# Patient Record
Sex: Female | Born: 1990 | Race: Black or African American | Hispanic: No | Marital: Single | Smoking: Never smoker
Health system: Southern US, Community
[De-identification: ages and names within clinical notes are randomized; demographics above are authoritative.]

## PROBLEM LIST (undated history)

## (undated) DIAGNOSIS — Z203 Contact with and (suspected) exposure to rabies: Secondary | ICD-10-CM

---
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
## 2012-01-06 ENCOUNTER — Encounter (HOSPITAL_COMMUNITY): Payer: Self-pay | Admitting: Emergency Medicine

## 2012-01-06 ENCOUNTER — Emergency Department (HOSPITAL_COMMUNITY)
Admission: EM | Admit: 2012-01-06 | Discharge: 2012-01-06 | Disposition: A | Discharge status: Home Health | Payer: Worker's Compensation | Attending: Emergency Medicine | Admitting: Emergency Medicine

## 2012-01-06 DIAGNOSIS — S61409A Unspecified open wound of unspecified hand, initial encounter: Secondary | ICD-10-CM | POA: Insufficient documentation

## 2012-01-06 DIAGNOSIS — Y990 Civilian activity done for income or pay: Secondary | ICD-10-CM | POA: Insufficient documentation

## 2012-01-06 DIAGNOSIS — W268XXA Contact with other sharp object(s), not elsewhere classified, initial encounter: Secondary | ICD-10-CM | POA: Insufficient documentation

## 2012-01-06 DIAGNOSIS — Y9289 Other specified places as the place of occurrence of the external cause: Secondary | ICD-10-CM | POA: Insufficient documentation

## 2012-01-06 DIAGNOSIS — S61412A Laceration without foreign body of left hand, initial encounter: Secondary | ICD-10-CM

## 2012-01-06 MED ORDER — NAPROXEN 500 MG PO TABS: 500.0000 mg | ORAL_TABLET | Freq: Two times a day (BID) | ORAL | Status: DC

## 2012-01-06 NOTE — ED Notes (Signed)
PT. PRESENTS WITH LACERATION AT RIGHT PALM SUSTAINED FROM A METAL AT WALL WHILE WORKING AT [employer] [facility] THIS EVENING.

## 2012-01-06 NOTE — ED Notes (Signed)
Patient given discharge paperwork; went over discharge instructions with patient.  Patient instructed to take naproxen as directed, to follow up with Urgent care in 7-10 days to have sutures removed, to keep wound covered and dry until tomorrow, and to wash with warm, soapy water beginning tomorrow.  Patient instructed to watch for signs/symptoms of infection, and to return to the ED for new, worsening, or concerning symptoms.

## 2012-01-06 NOTE — ED Provider Notes (Signed)
History     CSN: [id number]  Arrival date & time 01/06/12  1945   First MD Initiated Contact with Patient 01/06/12 2022      Chief Complaint  Patient presents with  . Laceration    (Consider location/radiation/quality/duration/timing/severity/associated sxs/prior treatment) HPI Comments: Patient with flap laceration just distal to base of right fifth digit on the palm sustained on a metal edge while at work. Patient rinsed with water prior to arrival and applied pressure. She has full range of motion of right fingers and hand. Patient states she had a tetanus shot when she was 18. No other treatments prior to arrival.  Patient is a 21 y.o. female presenting with skin laceration. The history is provided by the patient.  Laceration  The incident occurred 1 to 2 hours ago. The laceration is located on the right hand. The laceration is 3 cm in size. The laceration mechanism was a a metal edge. The pain is mild. The pain has been constant since onset. She reports no foreign bodies present. Her tetanus status is UTD.    History reviewed. No pertinent past medical history.  History reviewed. No pertinent past surgical history.  No family history on file.  History  Substance Use Topics  . Smoking status: Never Smoker   . Smokeless tobacco: Not on file  . Alcohol Use: No    OB History    Grav Para Term Preterm Abortions TAB SAB Ect Mult Living                  Review of Systems  Constitutional: Negative for activity change.  Musculoskeletal: Negative for joint swelling and arthralgias.  Skin: Positive for wound.  Neurological: Negative for weakness and numbness.    Allergies  Peanut-containing drug products; Penicillins; and Pineapple  Home Medications  No current outpatient prescriptions on file.  BP 137/82  Pulse 114  Temp(Src) 98.2 F (36.8 C) (Oral)  Resp 20  SpO2 97%  LMP 12/29/2011  Physical Exam  Nursing note and vitals reviewed. Constitutional: She is  oriented to person, place, and time. She appears well-developed and well-nourished.  HENT:  Head: Normocephalic and atraumatic.  Eyes: Pupils are equal, round, and reactive to light.  Neck: Normal range of motion. Neck supple.  Cardiovascular: Exam reveals no decreased pulses.   Pulses:      Radial pulses are 2+ on the right side, and 2+ on the left side.  Musculoskeletal: She exhibits tenderness. She exhibits no edema.       Hands:      Laceration explored. Mild oozing of blood. The wound extends only into subcutaneous tissue. No evidence of tendon involvement visualized. Patient has full range of motion in her fingers and hands. Normal cap refill in this finger. No foreign bodies identified on exam.  Neurological: She is alert and oriented to person, place, and time. No sensory deficit.       Motor, sensation, and vascular distal to the injury is fully intact.   Skin: Skin is warm and dry.  Psychiatric: She has a normal mood and affect.    ED Course  Procedures (including critical care time)  Labs Reviewed - No data to display No results found.   1. Laceration of left hand     8:52 PM Patient seen and examined. Pt states tetanus is UTD.  Vital signs reviewed and are as follows: Filed Vitals:   01/06/12 1956  BP: 137/82  Pulse: 114  Temp: 98.2 F (36.8 C)  Resp: 20   LACERATION REPAIR Performed by: Carolee Rota Authorized by: Carolee Rota Consent: Verbal consent obtained. Risks and benefits: risks, benefits and alternatives were discussed Consent given by: patient Patient identity confirmed: provided demographic data Prepped and Draped in normal sterile fashion Wound explored  Laceration Location: right palm  Laceration Length: 3cm  No Foreign Bodies seen or palpated, no tendon involvement identifed  Anesthesia: local infiltration  Local anesthetic: lidocaine 2% without epinephrine  Anesthetic total: 2 ml  Irrigation method: skin scrub with dermal  cleanser  Amount of cleaning: standard  Skin closure: 4-0 ethilon  Number of sutures: 3  Technique: simple interrupted  Patient tolerance: Patient tolerated the procedure well with no immediate complications.  8:54 PM Patient informed that given exam and history there is very low suspicion of foreign body, however discussed possibility of retained foreign body despite best effort to clean wound.  Patient was counseled on s/s of retained FB and infection and to return with worsening redness, pain, swelling, bleeding or discharge from wound, or if they have any other concerns.  Parents and patient verbalized understanding and agreed with plan.    8:55 PM Pt urged to return with worsening pain, worsening swelling, expanding area of redness or streaking up extremity, fever, or any other concerns. Pt verbalizes understanding and agrees with plan.  MDM  Laceration, no tendon involvement suspected. No foreign bodies identified. Patient has full range of motion in right fifth digit.        Renne Crigler, [place] 01/06/12 2111

## 2012-01-06 NOTE — ED Notes (Signed)
Patient cut right palm right below her fourth and fifth fingers on a piece of metal while working at [employer]; patient reports that the manager sprayed disinfectant spray on her palm to try and stop the bleeding.  Manager called Urgent Care and told patient to wrap up her hand with gauze; patient tried to go to Urgent Care tonight, but Urgent Care was closed.  Patient has right palm wrapped with gauze and tape at this time; no active bleeding noted.  Patient does not know when the last time she had her tetanus shot last.  PA is at bedside examining patient.  Will continue to monitor.

## 2012-01-06 NOTE — Discharge Instructions (Signed)
Please read and follow instructions below.   You will need to see your family doctor or go to Urgent care in 7-10 days to have the sutures removed.    Keep the wound covered and dry tonight.  You may wash with warm, soapy water beginning tomorrow.  Do not soak wound in water.   Please returnsooner with worsening pain, worsening redness or swelling around the wound, drainage from the wound, or if you have any other concerns.

## 2012-01-06 NOTE — ED Notes (Signed)
Sutures covered with dressing, per PA request.

## 2012-01-06 NOTE — ED Notes (Signed)
Registration at bedside.

## 2012-01-06 NOTE — ED Notes (Signed)
Suture cart placed at bedside. 

## 2012-01-07 NOTE — ED Provider Notes (Signed)
Medical screening examination/treatment/procedure(s) were performed by non-physician practitioner and as supervising physician I was immediately available for consultation/collaboration.   Gwyneth Sprout, MD 01/07/12 [phone]

## 2012-03-15 ENCOUNTER — Encounter (HOSPITAL_COMMUNITY): Payer: Self-pay

## 2012-03-15 ENCOUNTER — Emergency Department (INDEPENDENT_AMBULATORY_CARE_PROVIDER_SITE_OTHER)
Admission: EM | Admit: 2012-03-15 | Discharge: 2012-03-15 | Disposition: A | Discharge status: Home / Self Care | Payer: Medicaid Other | Source: Home / Self Care | Attending: Emergency Medicine | Admitting: Emergency Medicine

## 2012-03-15 DIAGNOSIS — M549 Dorsalgia, unspecified: Secondary | ICD-10-CM

## 2012-03-15 LAB — POCT URINALYSIS DIP (DEVICE)
Bilirubin Urine: NEGATIVE
Glucose, UA: NEGATIVE mg/dL
Ketones, ur: NEGATIVE mg/dL
Leukocytes, UA: NEGATIVE
Nitrite: NEGATIVE
pH: 6 (ref 5.0–8.0)

## 2012-03-15 LAB — POCT PREGNANCY, URINE: Preg Test, Ur: NEGATIVE

## 2012-03-15 MED ORDER — DICLOFENAC SODIUM 75 MG PO TBEC: 75.0000 mg | DELAYED_RELEASE_TABLET | Freq: Two times a day (BID) | ORAL | Status: AC

## 2012-03-15 NOTE — Discharge Instructions (Signed)
Should followup with your Dr. if pain persists beyond 4-6 weeks. In the meantime try to exercise her lower back can take this medicine for 7 days. Your exam was not suggestive of a kidney infection or any urinary infection    Back Exercises Back exercises help treat and prevent back injuries. The goal of back exercises is to increase the strength of your abdominal and back muscles and the flexibility of your back. These exercises should be started when you no longer have back pain. Back exercises include:  Pelvic Tilt. Lie on your back with your knees bent. Tilt your pelvis until the lower part of your back is against the floor. Hold this position 5 to 10 sec and repeat 5 to 10 times.   Knee to Chest. Pull first 1 knee up against your chest and hold for 20 to 30 seconds, repeat this with the other knee, and then both knees. This may be done with the other leg straight or bent, whichever feels better.   Sit-Ups or Curl-Ups. Bend your knees 90 degrees. Start with tilting your pelvis, and do a partial, slow sit-up, lifting your trunk only 30 to 45 degrees off the floor. Take at least 2 to 3 seconds for each sit-up. Do not do sit-ups with your knees out straight. If partial sit-ups are difficult, simply do the above but with only tightening your abdominal muscles and holding it as directed.   Hip-Lift. Lie on your back with your knees flexed 90 degrees. Push down with your feet and shoulders as you raise your hips a couple inches off the floor; hold for 10 seconds, repeat 5 to 10 times.   Back arches. Lie on your stomach, propping yourself up on bent elbows. Slowly press on your hands, causing an arch in your low back. Repeat 3 to 5 times. Any initial stiffness and discomfort should lessen with repetition over time.   Shoulder-Lifts. Lie face down with arms beside your body. Keep hips and torso pressed to floor as you slowly lift your head and shoulders off the floor.  Do not overdo your exercises,  especially in the beginning. Exercises may cause you some mild back discomfort which lasts for a few minutes; however, if the pain is more severe, or lasts for more than 15 minutes, do not continue exercises until you see your caregiver. Improvement with exercise therapy for back problems is slow.  See your caregivers for assistance with developing a proper back exercise program. Document Released: 11/23/2004 Document Revised: 10/05/2011 Document Reviewed: 10/16/2005 [facility] Patient Information 2012 [place], [place].Back Exercises Back exercises help treat and prevent back injuries. The goal is to increase your strength in your belly (abdominal) and back muscles. These exercises can also help with flexibility. Start these exercises when told by your doctor. HOME CARE Back exercises include: Pelvic Tilt.  Lie on your back with your knees bent. Tilt your pelvis until the lower part of your back is against the floor. Hold this position 5 to 10 sec. Repeat this exercise 5 to 10 times.  Knee to Chest.  Pull 1 knee up against your chest and hold for 20 to 30 seconds. Repeat this with the other knee. This may be done with the other leg straight or bent, whichever feels better. Then, pull both knees up against your chest.  Sit-Ups or Curl-Ups.  Bend your knees 90 degrees. Start with tilting your pelvis, and do a partial, slow sit-up. Only lift your upper half 30 to 45 degrees off  the floor. Take at least 2 to 3 seonds for each sit-up. Do not do sit-ups with your knees out straight. If partial sit-ups are difficult, simply do the above but with only tightening your belly (abdominal) muscles and holding it as told.  Hip-Lift.  Lie on your back with your knees flexed 90 degrees. Push down with your feet and shoulders as you raise your hips 2 inches off the floor. Hold for 10 seconds, repeat 5 to 10 times.  Back Arches.  Lie on your stomach. Prop yourself up on bent elbows. Slowly press on your hands,  causing an arch in your low back. Repeat 3 to 5 times.  Shoulder-Lifts.  Lie face down with arms beside your body. Keep hips and belly pressed to floor as you slowly lift your head and shoulders off the floor.  Do not overdo your exercises. Be careful in the beginning. Exercises may cause you some mild back discomfort. If the pain lasts for more than 15 minutes, stop the exercises until you see your doctor. Improvement with exercise for back problems is slow.  Document Released: 11/18/2010 Document Revised: 10/05/2011 Document Reviewed: 11/18/2010 [facility] Patient Information 2012 [place], [place].

## 2012-03-15 NOTE — ED Provider Notes (Signed)
History     CSN: [id number]  Arrival date & time 03/15/12  7829   First MD Initiated Contact with Patient 03/15/12 1003      Chief Complaint  Patient presents with  . Back Pain    (Consider location/radiation/quality/duration/timing/severity/associated sxs/prior treatment) HPI Comments: Mother brings her child in that she's been complaining of lower back pains on and off for several days. She is also concerned that she has multiple urinary infection she is concerned that maybe her daughter also inherited that rest. She did describes yesterday having some discomfort with urination and has been having this in term attempt pains on her lower and upper back, "it changes and goes from the right to the left side" . No fevers, no abdominal pains, no vaginal bleeding, no vaginal discharge. Describes movement and activity makes the pain worse at times but other times it doesn't make any difference.  Patient denies any constitutional symptoms such as fevers, or unintentional weight loss, fatigue changes in appetite.  Patient is a 21 y.o. female presenting with back pain.  Back Pain  Pertinent negatives include no chest pain, no fever, no dysuria and no pelvic pain.    History reviewed. No pertinent past medical history.  History reviewed. No pertinent past surgical history.  History reviewed. No pertinent family history.  History  Substance Use Topics  . Smoking status: Never Smoker   . Smokeless tobacco: Not on file  . Alcohol Use: No    OB History    Grav Para Term Preterm Abortions TAB SAB Ect Mult Living                  Review of Systems  Constitutional: Negative for fever, chills, activity change, appetite change, fatigue and unexpected weight change.  Respiratory: Negative for shortness of breath.   Cardiovascular: Negative for chest pain.  Genitourinary: Positive for urgency. Negative for dysuria, frequency, flank pain, vaginal bleeding, vaginal discharge, genital sores,  vaginal pain, menstrual problem and pelvic pain.  Musculoskeletal: Positive for back pain. Negative for joint swelling and gait problem.  Neurological: Negative for dizziness.    Allergies  Peanut-containing drug products; Penicillins; and Pineapple  Home Medications   Current Outpatient Rx  Name Route Sig Dispense Refill  . DICLOFENAC SODIUM 75 MG PO TBEC Oral Take 1 tablet (75 mg total) by mouth 2 (two) times daily. 10 tablet 0    BP 124/78  Pulse 96  Temp(Src) 98.3 F (36.8 C) (Oral)  Resp 18  SpO2 97%  LMP 02/23/2012  Physical Exam  Nursing note and vitals reviewed. Constitutional: Vital signs are normal. She appears well-developed and well-nourished.  Non-toxic appearance. She does not have a sickly appearance. She does not appear ill. No distress.  HENT:  Head: Normocephalic.  Eyes: Conjunctivae are normal.  Neck: Neck supple.  Cardiovascular: Normal rate.  Exam reveals no gallop and no friction rub.   No murmur heard. Pulmonary/Chest: Effort normal.  Abdominal: Soft.  Musculoskeletal: She exhibits no edema.       Lumbar back: She exhibits tenderness and pain. She exhibits normal range of motion, no bony tenderness, no swelling, no edema, no deformity, no spasm and normal pulse.       Back:  Neurological: She is alert.  Skin: No rash noted. No erythema.    ED Course  Procedures (including critical care time)   Labs Reviewed  POCT URINALYSIS DIP (DEVICE)  POCT PREGNANCY, URINE  URINALYSIS, DIPSTICK ONLY   No results found.  1. Back pain       MDM   Patient with a generalized back pain with a non-objective exam seem to be tender all over her lumbar region. No obvious deformities history of recent injuries or falls. No hematuria or proteinuria. Have discussed with mother to take her back to her pediatrician for followup if discomfort continue to persist. At this point patient does not seem to have a urinary tract infection.       Heather Molly,  MD 03/15/12 1101

## 2012-03-15 NOTE — ED Notes (Addendum)
Per patient and parent, pain  In lower back, better w laying on stomach rather than back(denies radiation of pain in to legs or buttocks) denies STD type of syp , but does have pain w urination ; no change in back pain w massage

## 2013-06-13 ENCOUNTER — Emergency Department (HOSPITAL_COMMUNITY)
Admission: EM | Admit: 2013-06-13 | Discharge: 2013-06-13 | Disposition: A | Discharge status: Home / Self Care | Payer: Self-pay | Attending: Emergency Medicine | Admitting: Emergency Medicine

## 2013-06-13 ENCOUNTER — Emergency Department (HOSPITAL_COMMUNITY): Payer: Self-pay

## 2013-06-13 ENCOUNTER — Encounter (HOSPITAL_COMMUNITY): Payer: Self-pay | Admitting: Emergency Medicine

## 2013-06-13 DIAGNOSIS — Y92009 Unspecified place in unspecified non-institutional (private) residence as the place of occurrence of the external cause: Secondary | ICD-10-CM | POA: Insufficient documentation

## 2013-06-13 DIAGNOSIS — Y9389 Activity, other specified: Secondary | ICD-10-CM | POA: Insufficient documentation

## 2013-06-13 DIAGNOSIS — S61409A Unspecified open wound of unspecified hand, initial encounter: Secondary | ICD-10-CM | POA: Insufficient documentation

## 2013-06-13 DIAGNOSIS — Z23 Encounter for immunization: Secondary | ICD-10-CM | POA: Insufficient documentation

## 2013-06-13 DIAGNOSIS — W540XXA Bitten by dog, initial encounter: Secondary | ICD-10-CM | POA: Insufficient documentation

## 2013-06-13 MED ORDER — TETANUS-DIPHTH-ACELL PERTUSSIS 5-2.5-18.5 LF-MCG/0.5 IM SUSP
0.5000 mL | Freq: Once | INTRAMUSCULAR | Status: AC
  Administered 2013-06-13: 0.5 mL via INTRAMUSCULAR
  Filled 2013-06-13: qty 0.5

## 2013-06-13 MED ORDER — RABIES VACCINE, PCEC IM SUSR
1.0000 mL | Freq: Once | INTRAMUSCULAR | Status: AC
  Administered 2013-06-13: 1 mL via INTRAMUSCULAR
  Filled 2013-06-13: qty 1

## 2013-06-13 MED ORDER — TRAMADOL HCL 50 MG PO TABS
50.0000 mg | ORAL_TABLET | Freq: Once | ORAL | Status: AC
  Administered 2013-06-13: 50 mg via ORAL
  Filled 2013-06-13: qty 1

## 2013-06-13 MED ORDER — RABIES IMMUNE GLOBULIN 150 UNIT/ML IM INJ
20.0000 [IU]/kg | INJECTION | Freq: Once | INTRAMUSCULAR | Status: AC
  Administered 2013-06-13: 1500 [IU]
  Filled 2013-06-13: qty 10

## 2013-06-13 MED ORDER — DOXYCYCLINE HYCLATE 100 MG PO CAPS: 100.0000 mg | ORAL_CAPSULE | Freq: Two times a day (BID) | ORAL | Status: DC

## 2013-06-13 MED ORDER — HYDROCODONE-ACETAMINOPHEN 5-325 MG PO TABS: 1.0000 | ORAL_TABLET | ORAL | Status: DC | PRN

## 2013-06-13 NOTE — ED Provider Notes (Signed)
CSN: [id number]     Arrival date & time 06/13/13  1652 History    This chart was scribed for Emilia Beck, PA working with Shon Baton, MD by Quintella Reichert, ED Scribe. This patient was seen in room TR05C/TR05C and the patient's care was started at 4:59 PM.      Chief Complaint  Patient presents with  . Animal Bite    Patient is a 22 y.o. female presenting with animal bite. The history is provided by the patient. No language interpreter was used.  Animal Bite Contact animal:  Dog (pitbull) Location:  Hand Hand injury location:  R hand Time since incident: several hours ago. Pain details:    Severity:  No pain Provoked: unprovoked   Animal's rabies vaccination status:  Unknown Animal in possession: no   Tetanus status:  Up to date Relieved by:  None tried Worsened by:  Nothing tried Ineffective treatments:  None tried Associated symptoms: no fever, no numbness and no rash      HPI Comments: Heather Joyce is a 22 y.o. female who presents to the Emergency Department complaining of 2 dog bites to the right hand sustained earlier today.  Pt states that she was in her yard when a pitbull came out of the woods and bit her in the hand without provocation and her father had to "beat the dog off."  She does not know who the owner of the dog is and does not know whether the dog is up to date on rabies vaccinations.  Presently she notes 2 moderately painful lacerations to the top of her right hand and thumb.  Bleeding is controlled.  She denies weakness, numbness or tingling.     No past medical history on file.   No past surgical history on file.   No family history on file.   History  Substance Use Topics  . Smoking status: Never Smoker   . Smokeless tobacco: Not on file  . Alcohol Use: No    OB History   Grav Para Term Preterm Abortions TAB SAB Ect Mult Living                   Review of Systems  Constitutional: Negative for fever.  Skin: Positive for  wound. Negative for rash.  Neurological: Negative for weakness and numbness.  All other systems reviewed and are negative.      Allergies  Peanut-containing drug products; Penicillins; and Pineapple  Home Medications  No current outpatient prescriptions on file.  BP 137/91  Pulse 101  Temp(Src) 98 F (36.7 C) (Oral)  Resp 16  SpO2 100%  Physical Exam  Nursing note and vitals reviewed. Constitutional: She is oriented to person, place, and time. She appears well-developed and well-nourished. No distress.  HENT:  Head: Normocephalic and atraumatic.  Eyes: EOM are normal.  Neck: Neck supple. No tracheal deviation present.  Cardiovascular: Normal rate.   Pulmonary/Chest: Effort normal. No respiratory distress.  Musculoskeletal:       Right hand: She exhibits decreased range of motion and laceration. She exhibits normal capillary refill and no deformity.  Right hand ROM limited due to pain. Tenderness to palpation surrounding lacerations. Capillary refill sufficient distal to injury. No obvious deformity.  Neurological: She is alert and oriented to person, place, and time.  Skin: Skin is warm and dry. Laceration noted.  1.5-cm laceration to the dorsal right hand over first metacarpal bone.  Bleeding controlled. 0.5-cm laceration on dorsal aspect of right thumb.  Abrasion on distal middle finger.  Psychiatric: She has a normal mood and affect. Her behavior is normal.    ED Course  Procedures (including critical care time)  DIAGNOSTIC STUDIES: Oxygen Saturation is 100% on room air, normal by my interpretation.    COORDINATION OF CARE: 5:05 PM-Discussed treatment plan which includes hand x-ray, pain medication, antibiotics and rabies treatment with pt at bedside and pt agreed to plan.    Labs Reviewed - No data to display Dg Hand Complete Left  06/13/2013   **ADDENDUM** CREATED: 06/13/2013 19:20:23  Please ignore the above report.  This is the correct report.  *RADIOLOGY  REPORT*  Clinical Data: Right hand laceration  Right HAND - COMPLETE 3+ VIEW  Technique: Three views right hand x-ray  Comparison:  None.  Findings: Three views of the right hand submitted.  No acute fracture or subluxation.  No radiopaque foreign body.  IMPRESSION: No acute fracture or subluxation.  **END ADDENDUM** SIGNED BY: Natasha Mead, M.D.  06/13/2013   *RADIOLOGY REPORT*  Clinical Data: Laceration  LEFT HAND - COMPLETE 3+ VIEW  Comparison: None.  Findings: Three views of the left hand submitted.  No acute fracture or subluxation.  No radiopaque foreign body.  IMPRESSION: No acute fracture or subluxation.   Original Report Authenticated By: Natasha Mead, M.D.   1. Dog bite, initial encounter     MDM  6:04 PM Patient will have xray of right hand to rule out bony injury. Patient will have antibiotics, tdap, and rabies injection here. Patient will have Tramadol for pain. Vitals stable and patient afebrile. Patient will have rabies injection schedule to follow.    7:02 PM Xray unremarkable for bone injury. Patient will be discharged with augmentin and vicodin for pain. Rabies injections given.    I personally performed the services described in this documentation, which was scribed in my presence. The recorded information has been reviewed and is accurate.    Emilia Beck, PA-C 06/13/13 2002

## 2013-06-13 NOTE — ED Notes (Signed)
Patient was in her yard, and a pitbull came out of the woods and bit her in her hand.  The patient has a small laceration between her pointing finger and thumb on her right hand.  Bleeding is controlled.

## 2013-06-13 NOTE — ED Notes (Signed)
Discharge instructions and information reviewed with patient. Rabies Exposure instructions for patient printed, reviewed and signed by patient. Copy provided to patient. Copies also sent to urgent care center and pharmacy. Patient verbalized understanding and agrees with plan of care.

## 2013-06-13 NOTE — ED Notes (Signed)
Patient transported to X-ray 

## 2013-06-14 NOTE — ED Provider Notes (Signed)
Medical screening examination/treatment/procedure(s) were performed by non-physician practitioner and as supervising physician I was immediately available for consultation/collaboration.  Caitlin Ainley F Lesleyanne Politte, MD 06/14/13 0817 

## 2013-06-16 ENCOUNTER — Encounter (HOSPITAL_COMMUNITY): Payer: Self-pay | Admitting: *Deleted

## 2013-06-16 ENCOUNTER — Emergency Department (INDEPENDENT_AMBULATORY_CARE_PROVIDER_SITE_OTHER)
Admission: EM | Admit: 2013-06-16 | Discharge: 2013-06-16 | Disposition: A | Discharge status: Home / Self Care | Payer: Self-pay | Source: Home / Self Care | Attending: Emergency Medicine | Admitting: Emergency Medicine

## 2013-06-16 DIAGNOSIS — S61451D Open bite of right hand, subsequent encounter: Secondary | ICD-10-CM

## 2013-06-16 DIAGNOSIS — Z203 Contact with and (suspected) exposure to rabies: Secondary | ICD-10-CM

## 2013-06-16 MED ORDER — HYDROCODONE-ACETAMINOPHEN 5-325 MG PO TABS: 1.0000 | ORAL_TABLET | Freq: Four times a day (QID) | ORAL | Status: DC | PRN

## 2013-06-16 MED ORDER — ONDANSETRON HCL 8 MG PO TABS: ORAL_TABLET | ORAL | Status: DC

## 2013-06-16 MED ORDER — RABIES VACCINE, PCEC IM SUSR
1.0000 mL | Freq: Once | INTRAMUSCULAR | Status: AC
  Administered 2013-06-16: 1 mL via INTRAMUSCULAR

## 2013-06-16 MED ORDER — RABIES VACCINE, PCEC IM SUSR
INTRAMUSCULAR | Status: AC
  Filled 2013-06-16: qty 1

## 2013-06-16 NOTE — ED Notes (Signed)
Pt  Here   For  Rabies  Shot  And  Also  Wants  To  See  Doctor  About  Hand     She  Reports  Pain and  Swelling  r  Hand  At the  Bite  Site      She  Also  Reports     Some  Nausea         When  She  Takes  The  Doxy

## 2013-06-16 NOTE — ED Provider Notes (Signed)
CSN: [id number]     Arrival date & time 06/16/13  1211 History     First MD Initiated Contact with Patient 06/16/13 1341     Chief Complaint  Patient presents with  . Animal Bite   (Consider location/radiation/quality/duration/timing/severity/associated sxs/prior Treatment) HPI Comments: Patient here for first rabies injection. Dog bite to right hand. Doxycycline making her vomit. Also hand very painful. The pain is not increasing, it is just not resolving. There is some swelling, but there is no redness or discharge. She denies any fever, chills, or difficulty moving her fingers. There is no numbness in the hand. Her main issue is that the doxycycline is upsetting her stomach. She is taking this without food as directed on the bottle. She denies any diarrhea  Patient is a 22 y.o. female presenting with animal bite.  Animal Bite Associated symptoms: no fever and no rash     History reviewed. No pertinent past medical history. History reviewed. No pertinent past surgical history. History reviewed. No pertinent family history. History  Substance Use Topics  . Smoking status: Never Smoker   . Smokeless tobacco: Never Used  . Alcohol Use: No   OB History   Grav Para Term Preterm Abortions TAB SAB Ect Mult Living                 Review of Systems  Constitutional: Negative for fever and chills.  Eyes: Negative for visual disturbance.  Respiratory: Negative for cough and shortness of breath.   Cardiovascular: Negative for chest pain, palpitations and leg swelling.  Gastrointestinal: Negative for nausea, vomiting and abdominal pain.  Endocrine: Negative for polydipsia and polyuria.  Genitourinary: Negative for dysuria, urgency and frequency.  Musculoskeletal: Negative for myalgias and arthralgias.  Skin: Positive for wound (see history of present illness). Negative for rash.  Neurological: Negative for dizziness, weakness and light-headedness.    Allergies  Peanut-containing  drug products; Penicillins; and Pineapple  Home Medications   Current Outpatient Rx  Name  Route  Sig  Dispense  Refill  . doxycycline (VIBRAMYCIN) 100 MG capsule   Oral   Take 1 capsule (100 mg total) by mouth 2 (two) times daily.   20 capsule   0   . HYDROcodone-acetaminophen (NORCO) 5-325 MG per tablet   Oral   Take 1 tablet by mouth every 6 (six) hours as needed for pain.   20 tablet   0   . HYDROcodone-acetaminophen (NORCO/VICODIN) 5-325 MG per tablet   Oral   Take 1-2 tablets by mouth every 4 (four) hours as needed for pain.   8 tablet   0   . ondansetron (ZOFRAN) 8 MG tablet      1 tab PO 30 min prior to taking antibiotic   15 tablet   1    BP 150/82  Pulse 90  Temp(Src) 98.5 F (36.9 C) (Oral)  Resp 16  SpO2 100%  LMP 05/08/2013 Physical Exam  Constitutional: She is oriented to person, place, and time. She appears well-developed and well-nourished. No distress.  HENT:  Head: Normocephalic and atraumatic.  Eyes: Conjunctivae are normal. Pupils are equal, round, and reactive to light.  Pulmonary/Chest: Effort normal.  Musculoskeletal:       Hands: Neurological: She is alert and oriented to person, place, and time. Coordination normal.  Skin: Skin is warm and dry. No rash noted. She is not diaphoretic.  Psychiatric: She has a normal mood and affect. Judgment normal.    ED Course   Procedures (including  critical care time)  Labs Reviewed - No data to display No results found. 1. Animal bite of hand, right, subsequent encounter     MDM  8 mg Zofran 30 minutes prior to doxycycline, prescription of Vicodin for pain. Followup immediately if worsening. Otherwise keep to schedule for a rabies vaccinations   Meds ordered this encounter  Medications  . rabies vaccine (RABAVERT) injection 1 mL    Sig:   . ondansetron (ZOFRAN) 8 MG tablet    Sig: 1 tab PO 30 min prior to taking antibiotic    Dispense:  15 tablet    Refill:  1  .  HYDROcodone-acetaminophen (NORCO) 5-325 MG per tablet    Sig: Take 1 tablet by mouth every 6 (six) hours as needed for pain.    Dispense:  20 tablet    Refill:  0     Graylon Good, PA-C 06/17/13 2157

## 2013-06-16 NOTE — ED Notes (Signed)
chart review

## 2013-06-17 NOTE — ED Provider Notes (Signed)
Medical screening examination/treatment/procedure(s) were performed by non-physician practitioner and as supervising physician I was immediately available for consultation/collaboration.  Leslee Home, M.D.  Reuben Likes, MD 06/17/13 2221

## 2013-06-20 ENCOUNTER — Encounter (HOSPITAL_COMMUNITY): Payer: Self-pay | Admitting: Emergency Medicine

## 2013-06-20 ENCOUNTER — Emergency Department (HOSPITAL_COMMUNITY)
Admission: EM | Admit: 2013-06-20 | Discharge: 2013-06-20 | Disposition: A | Discharge status: Home / Self Care | Payer: Self-pay | Source: Home / Self Care

## 2013-06-20 MED ORDER — RABIES VACCINE, PCEC IM SUSR
INTRAMUSCULAR | Status: AC
  Filled 2013-06-20: qty 1

## 2013-06-20 MED ORDER — RABIES VACCINE, PCEC IM SUSR
1.0000 mL | Freq: Once | INTRAMUSCULAR | Status: AC
  Administered 2013-06-20: 1 mL via INTRAMUSCULAR

## 2013-06-20 NOTE — ED Notes (Signed)
Pt here for rabies injection.  Pt voices no concerns at this time.  

## 2013-07-06 ENCOUNTER — Encounter (HOSPITAL_COMMUNITY): Payer: Self-pay

## 2013-07-06 ENCOUNTER — Emergency Department (HOSPITAL_COMMUNITY)
Admission: EM | Admit: 2013-07-06 | Discharge: 2013-07-06 | Disposition: A | Discharge status: Home / Self Care | Payer: Self-pay | Attending: Emergency Medicine | Admitting: Emergency Medicine

## 2013-07-06 DIAGNOSIS — Z4800 Encounter for change or removal of nonsurgical wound dressing: Secondary | ICD-10-CM | POA: Insufficient documentation

## 2013-07-06 DIAGNOSIS — W540XXS Bitten by dog, sequela: Secondary | ICD-10-CM

## 2013-07-06 DIAGNOSIS — Z880 Allergy status to penicillin: Secondary | ICD-10-CM | POA: Insufficient documentation

## 2013-07-06 DIAGNOSIS — Z203 Contact with and (suspected) exposure to rabies: Secondary | ICD-10-CM | POA: Insufficient documentation

## 2013-07-06 HISTORY — DX: Contact with and (suspected) exposure to rabies: Z20.3

## 2013-07-06 MED ORDER — HYDROCODONE-ACETAMINOPHEN 5-325 MG PO TABS
2.0000 | ORAL_TABLET | Freq: Once | ORAL | Status: AC
  Administered 2013-07-06: 2 via ORAL
  Filled 2013-07-06: qty 2

## 2013-07-06 MED ORDER — ONDANSETRON 8 MG PO TBDP
8.0000 mg | ORAL_TABLET | Freq: Once | ORAL | Status: AC
  Administered 2013-07-06: 8 mg via ORAL
  Filled 2013-07-06: qty 1

## 2013-07-06 MED ORDER — AMOXICILLIN-POT CLAVULANATE 875-125 MG PO TABS: 1.0000 | ORAL_TABLET | Freq: Two times a day (BID) | ORAL | Status: DC

## 2013-07-06 NOTE — ED Notes (Signed)
Pt escorted to discharge window. Pt verbalized understanding discharge instructions. In no acute distress.  

## 2013-07-06 NOTE — ED Notes (Signed)
MD at bedside.  EDP Knapp in to evaluate this pt

## 2013-07-06 NOTE — ED Provider Notes (Signed)
CSN: [id number]     Arrival date & time 07/06/13  1759 History  This chart was scribed for Celene Kras, MD by Carl Best, ED Scribe. This patient was seen in room WTR6/WTR6 and the patient's care was started at 6:38 PM.     Chief Complaint  Patient presents with  . Wound Check    Patient is a 22 y.o. female presenting with wound check. The history is provided by the patient. No language interpreter was used.  Wound Check This is a new problem. The current episode started more than 2 days ago. The problem occurs constantly. The problem has not changed since onset.Pertinent negatives include no chest pain, no abdominal pain and no headaches. Exacerbated by: Movement. The symptoms are relieved by ice [facility]). Treatments tried: [facility] and ice compress. The treatment provided mild relief.   HPI Comments:  Heather Joyce is a 22 y.o. female brought in  to the Emergency Department complaining of a constant, non-radiating, aching pain to her right hand.  Patient stated that she was bitten by a dog at the effected area on August 15th, 2014.  She stated that she went to urgent care and received a rabies injection and antibiotics which she finished on the August 25th, 2014.  Patient then stated that the bite mark scabbed over a week ago but started swelling last 5 days ago.  Patient stated that the effected area is very hard and will sometimes cause the entire hand to ache.  Patient stated that icy hot and cold compresses alleviate the pain.  Patient states that the pain is aggravated with movement.  She also stated that she has tried elevating the hand with no relief.  Patient denies associated emesis, fever, headache, and nausea.  Patient denies other associated medical problems.  Past Medical History  Diagnosis Date  . 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 No pertinent past surgical history. No family history on file. History  Substance Use Topics  . Smoking status: Never Smoker   . Smokeless  tobacco: Never Used  . Alcohol Use: No   OB History   Grav Para Term Preterm Abortions TAB SAB Ect Mult Living                 Review of Systems  Constitutional: Negative for fever.  Cardiovascular: Negative for chest pain.  Gastrointestinal: Negative for nausea, vomiting and abdominal pain.  Skin: Positive for wound (Dog bite).  Neurological: Negative for headaches.  All other systems reviewed and are negative.    Allergies  Peanut-containing drug products; Penicillins; and Pineapple  Home Medications   Current Outpatient Rx  Name  Route  Sig  Dispense  Refill  . HYDROcodone-acetaminophen (NORCO) 5-325 MG per tablet   Oral   Take 1 tablet by mouth every 6 (six) hours as needed for pain.   20 tablet   0   . ibuprofen (ADVIL,MOTRIN) 200 MG tablet   Oral   Take 400 mg by mouth every 6 (six) hours as needed for pain.          Triage Vitals: BP 125/65  Pulse 92  Temp(Src) 99.4 F (37.4 C)  Resp 17  SpO2 100%  LMP 06/16/2013  Physical Exam  Nursing note and vitals reviewed. Constitutional: She is oriented to person, place, and time. She appears well-developed and well-nourished. No distress.  HENT:  Head: Normocephalic and atraumatic.  Right Ear: External ear normal.  Left Ear: External ear normal.  Nose:  Nose normal.  Mouth/Throat: Oropharynx is clear and moist.  Eyes: Conjunctivae are normal.  Neck: Normal range of motion.  Cardiovascular: Normal rate, regular rhythm and normal heart sounds.   Pulmonary/Chest: Effort normal and breath sounds normal. No stridor. No respiratory distress. She has no wheezes. She has no rales.  Abdominal: Soft. She exhibits no distension.  Musculoskeletal: Normal range of motion.  Neurological: She is alert and oriented to person, place, and time. She has normal strength.  Skin: Skin is warm and dry. She is not diaphoretic. There is erythema (dorsal aspect of the right hand).  Faint erythema over the dorsal aspect of the right  hand associated with mild swelling. Scar tissue palpated.   Psychiatric: She has a normal mood and affect. Her behavior is normal.    ED Course  Procedures (including critical care time)  DIAGNOSTIC STUDIES: Oxygen Saturation is 100% on room air, normal by my interpretation.    COORDINATION OF CARE: 6:41 PM- Discussed starting medications for pain with patient and patient agreed to treatment plan.    Medications  HYDROcodone-acetaminophen (NORCO/VICODIN) 5-325 MG per tablet 2 tablet (2 tablets Oral Given 07/06/13 1852)  ondansetron (ZOFRAN-ODT) disintegrating tablet 8 mg (8 mg Oral Given 07/06/13 1852)     Labs Review Labs Reviewed - No data to display Imaging Review No results found.  MDM   1. Dog bite, sequela    Pt presents after dog bite 3 weeks ago. She received rabies injections and was covered with doxycycline. Today no obvious signs of infection. Pain is likely due to scar tissue, but will cover pt with Augmentin. Neurovascularly intact. Compartment soft. Follow up with PCP. Return instructions given. Vital signs stable for discharge. Dr. Lynelle Doctor evaluated patient and agrees with plan. Patient / Family / Caregiver informed of clinical course, understand medical decision-making process, and agree with plan.   I personally performed the services described in this documentation, which was scribed in my presence. The recorded information has been reviewed and is accurate.     Mora Bellman, PA-C 07/06/13 2034

## 2013-07-06 NOTE — ED Notes (Addendum)
Dog bite 06/13/2013.  Treated for it and rabies vaccine up to date.  Pt c/o of redness, painful and swelling last Thursday. No drainage noticed. No fever

## 2013-07-07 NOTE — ED Provider Notes (Signed)
Medical screening examination/treatment/procedure(s) were performed by non-physician practitioner and as supervising physician I was immediately available for consultation/collaboration.    Shakeel Disney R Rylend Pietrzak, MD 07/07/13 0026 

## 2013-12-04 ENCOUNTER — Encounter (HOSPITAL_COMMUNITY): Payer: Self-pay | Admitting: Emergency Medicine

## 2013-12-04 ENCOUNTER — Emergency Department (HOSPITAL_COMMUNITY)
Admission: EM | Admit: 2013-12-04 | Discharge: 2013-12-04 | Disposition: A | Discharge status: Home / Self Care | Payer: Self-pay | Attending: Emergency Medicine | Admitting: Emergency Medicine

## 2013-12-04 DIAGNOSIS — L03317 Cellulitis of buttock: Principal | ICD-10-CM

## 2013-12-04 DIAGNOSIS — L0231 Cutaneous abscess of buttock: Secondary | ICD-10-CM | POA: Insufficient documentation

## 2013-12-04 DIAGNOSIS — Z8619 Personal history of other infectious and parasitic diseases: Secondary | ICD-10-CM | POA: Insufficient documentation

## 2013-12-04 DIAGNOSIS — Z880 Allergy status to penicillin: Secondary | ICD-10-CM | POA: Insufficient documentation

## 2013-12-04 MED ORDER — SULFAMETHOXAZOLE-TRIMETHOPRIM 800-160 MG PO TABS: 2.0000 | ORAL_TABLET | Freq: Two times a day (BID) | ORAL | Status: DC

## 2013-12-04 MED ORDER — HYDROCODONE-ACETAMINOPHEN 5-325 MG PO TABS: 1.0000 | ORAL_TABLET | Freq: Four times a day (QID) | ORAL | Status: DC | PRN

## 2013-12-04 MED ORDER — HYDROMORPHONE HCL PF 1 MG/ML IJ SOLN
1.0000 mg | Freq: Once | INTRAMUSCULAR | Status: AC
  Administered 2013-12-04: 1 mg via INTRAMUSCULAR
  Filled 2013-12-04: qty 1

## 2013-12-04 MED ORDER — IBUPROFEN 800 MG PO TABS: 800.0000 mg | ORAL_TABLET | Freq: Three times a day (TID) | ORAL | Status: DC | PRN

## 2013-12-04 NOTE — ED Notes (Signed)
Patient has a large abscess on left upper buttock x 5 days. Abscess has gotten worse.

## 2013-12-04 NOTE — Progress Notes (Signed)
[facility] provided pt with a list of primary care resources and [facility] information.  °

## 2013-12-04 NOTE — ED Provider Notes (Signed)
CSN: [id number]     Arrival date & time 12/04/13  [id number] History   First MD Initiated Contact with Patient 12/04/13 [phone]     Chief Complaint  Patient presents with  . Abscess   (Consider location/radiation/quality/duration/timing/severity/associated sxs/prior Treatment) HPI Patient presents to the emergency department with abscess to the left upper buttocks medially.  The patient, states, that the area started forming about 5 days, ago.  Patient, states she did not take any medications prior to arrival.  The patient denies fever, chest pain, shortness of breath, abdominal pain, diarrhea, weakness, numbness, dizziness, and or vomiting. Past Medical History  Diagnosis Date  . 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 Dental surgery     History reviewed. No pertinent family history. History  Substance Use Topics  . Smoking status: Never Smoker   . Smokeless tobacco: Never Used  . Alcohol Use: No   OB History   Grav Para Term Preterm Abortions TAB SAB Ect Mult Living                 Review of Systems All other systems negative except as documented in the HPI. All pertinent positives and negatives as reviewed in the HPI. Allergies  Peanut-containing drug products; Penicillins; and Pineapple  Home Medications   Current Outpatient Rx  Name  Route  Sig  Dispense  Refill  . ibuprofen (ADVIL,MOTRIN) 200 MG tablet   Oral   Take 400 mg by mouth every 6 (six) hours as needed for moderate pain.           LMP 11/28/2013 Physical Exam  Nursing note and vitals reviewed. Constitutional: She appears well-developed and well-nourished. No distress.  HENT:  Head: Normocephalic and atraumatic.  Eyes: Pupils are equal, round, and reactive to light.  Neurological: She is alert.  Skin: Skin is warm and dry. No rash noted. No erythema.       ED Course  Procedures (including critical care time)   INCISION AND DRAINAGE Performed by: Carlyle DollyLAWYER,Adryel Wortmann  W Consent: Verbal consent obtained. Risks and benefits: risks, benefits and alternatives were discussed Type: abscess  Body area:left upper buttock  Anesthesia: local infiltration  Incision was made with a scalpel.  Local anesthetic: lidocaine2% without epinephrine  Anesthetic total: 6 ml  Complexity: complex Blunt dissection to break up loculations  Drainage: purulent  Drainage amount: large  Packing material: half  in iodoform gauze  Patient tolerance: Patient tolerated the procedure well with no immediate complications.     Carlyle Dollyhristopher W Shawnna Pancake, PA-C 12/05/13 [phone]

## 2013-12-04 NOTE — Discharge Instructions (Signed)
Return here as needed. Follow up with the surgeon in the next 2 days or you will need to return here. Keep area covered

## 2013-12-06 ENCOUNTER — Emergency Department (HOSPITAL_COMMUNITY)
Admission: EM | Admit: 2013-12-06 | Discharge: 2013-12-06 | Disposition: A | Discharge status: Home / Self Care | Payer: Self-pay | Attending: Emergency Medicine | Admitting: Emergency Medicine

## 2013-12-06 ENCOUNTER — Encounter (HOSPITAL_COMMUNITY): Payer: Self-pay | Admitting: Emergency Medicine

## 2013-12-06 DIAGNOSIS — Z880 Allergy status to penicillin: Secondary | ICD-10-CM | POA: Insufficient documentation

## 2013-12-06 DIAGNOSIS — R110 Nausea: Secondary | ICD-10-CM | POA: Insufficient documentation

## 2013-12-06 DIAGNOSIS — Z792 Long term (current) use of antibiotics: Secondary | ICD-10-CM | POA: Insufficient documentation

## 2013-12-06 DIAGNOSIS — Z4801 Encounter for change or removal of surgical wound dressing: Secondary | ICD-10-CM | POA: Insufficient documentation

## 2013-12-06 DIAGNOSIS — Z5189 Encounter for other specified aftercare: Secondary | ICD-10-CM

## 2013-12-06 NOTE — ED Provider Notes (Signed)
Medical screening examination/treatment/procedure(s) were performed by non-physician practitioner and as supervising physician I was immediately available for consultation/collaboration.  Tally Mattox M Carla Whilden, MD 12/06/13 2012 

## 2013-12-06 NOTE — ED Provider Notes (Signed)
CSN: [id number]     Arrival date & time 12/06/13  1231 History  This chart was scribed for non-physician practitioner Arthor Captain working with Hurman Horn, MD by Carl Best, ED Scribe. This patient was seen in room WTR5/WTR5 and the patient's care was started at 1:02 PM.    Chief Complaint  Patient presents with  . Wound Check    The history is provided by the patient. No language interpreter was used.   HPI Comments: Heather Joyce is a 23 y.o. female who presents to the Emergency Department needing a wound check.  The patient states that she was seen 2 days ago in the ED for an I&D of an abscess on her gluteal cleft.  She states that the abscess was packed and she was prescribed Bactrim,  The patient states that she has been experiencing nausea since taking the Bactrim.  She states that she has been experiencing some drainage from the area.  She denies fever and chills as associated symptoms.   Past Medical History  Diagnosis Date  . 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 Dental surgery     No family history on file. History  Substance Use Topics  . Smoking status: Never Smoker   . Smokeless tobacco: Never Used  . Alcohol Use: No   OB History   Grav Para Term Preterm Abortions TAB SAB Ect Mult Living                 Review of Systems  Constitutional: Negative for fever and chills.  Gastrointestinal: Positive for nausea.  Skin: Positive for wound (I&D in gluteal cleft).  All other systems reviewed and are negative.    Allergies  Peanut-containing drug products; Penicillins; and Pineapple  Home Medications   Current Outpatient Rx  Name  Route  Sig  Dispense  Refill  . HYDROcodone-acetaminophen (NORCO/VICODIN) 5-325 MG per tablet   Oral   Take 1 tablet by mouth every 6 (six) hours as needed for moderate pain.   15 tablet   0   . ibuprofen (ADVIL,MOTRIN) 200 MG tablet   Oral   Take 400 mg by mouth every 6 (six) hours as  needed for moderate pain.          Marland Kitchen ibuprofen (ADVIL,MOTRIN) 800 MG tablet   Oral   Take 1 tablet (800 mg total) by mouth every 8 (eight) hours as needed.   21 tablet   0   . sulfamethoxazole-trimethoprim (SEPTRA DS) 800-160 MG per tablet   Oral   Take 2 tablets by mouth 2 (two) times daily.   40 tablet   0    Triage Vitals: BP 103/73  Pulse 95  Temp(Src) 98.1 F (36.7 C) (Oral)  Resp 16  SpO2 96%  LMP 11/28/2013  Physical Exam  Nursing note and vitals reviewed. Constitutional: She is oriented to person, place, and time. She appears well-developed and well-nourished.  HENT:  Head: Normocephalic and atraumatic.  Eyes: EOM are normal.  Neck: Normal range of motion.  Cardiovascular: Normal rate.   Pulmonary/Chest: Effort normal.  Musculoskeletal: Normal range of motion.  Neurological: She is alert and oriented to person, place, and time.  Skin: Skin is warm and dry.  Psychiatric: She has a normal mood and affect. Her behavior is normal.    ED Course  Procedures (including critical care time)  DIAGNOSTIC STUDIES: Oxygen Saturation is 96% on room air, adequate by my interpretation.  COORDINATION OF CARE: 1:06 PM- Cleaned the area and advised the patient that the area is healing properly.  Advised the patient to return to the ED if the area becomes infected or she starts running a fever.  Advised the patient to eat yogurt or probiotics in between her doses of Bactrim to alleviate the nausea.  The patient agreed to the treatment plan.   Labs Review Labs Reviewed - No data to display Imaging Review No results found.  EKG Interpretation   None       MDM   1. Encounter for wound re-check    Patient wound appears well healing. No need to return to ED for further checks unless no sxs, fever, nausea, or concerns for new infection.  I personally performed the services described in this documentation, which was scribed in my presence. The recorded information  has been reviewed and is accurate.     Arthor CaptainAbigail Kriss Perleberg, PA-C 12/06/13 1318

## 2013-12-06 NOTE — ED Notes (Signed)
Per pt, here 2 days ago and needs wound rechecked

## 2013-12-06 NOTE — Discharge Instructions (Signed)
Wound Check  Your wound appears healthy today. Your wound will heal gradually over time. Eventually a scar will form that will fade with time.  FACTORS THAT AFFECT SCAR FORMATION:   People differ in the severity in which they scar.   Scar severity varies according to location, size, and the traits you inherited from your parents (genetic predisposition).   Irritation to the wound from infection, rubbing, or chemical exposure will increase the amount of scar formation.  HOME CARE INSTRUCTIONS    If you were given a dressing, you should change it at least once a day or as instructed by your caregiver. If the bandage sticks, soak it off with a solution of hydrogen peroxide.   If the bandage becomes wet, dirty, or develops a bad smell, change it as soon as possible.   Look for signs of infection.   Only take over-the-counter or prescription medicines for pain, discomfort, or fever as directed by your caregiver.  SEEK IMMEDIATE MEDICAL CARE IF:    You have redness, swelling, or increasing pain in the wound.   You notice pus coming from the wound.   You have a fever.   You notice a bad smell coming from the wound or dressing.  Document Released: 07/22/2004 Document Revised: 01/08/2012 Document Reviewed: 10/16/2005  Exit[employer] Patient Information 2014 [employer].

## 2013-12-07 NOTE — ED Provider Notes (Signed)
Medical screening examination/treatment/procedure(s) were performed by non-physician practitioner and as supervising physician I was immediately available for consultation/collaboration.  EKG Interpretation   None         Baily Serpe, MD 12/07/13 0854 

## 2014-01-30 ENCOUNTER — Emergency Department (HOSPITAL_COMMUNITY)
Admission: EM | Admit: 2014-01-30 | Discharge: 2014-01-30 | Disposition: A | Discharge status: Home / Self Care | Payer: Self-pay | Attending: Emergency Medicine | Admitting: Emergency Medicine

## 2014-01-30 ENCOUNTER — Encounter (HOSPITAL_COMMUNITY): Payer: Self-pay | Admitting: Emergency Medicine

## 2014-01-30 DIAGNOSIS — Y9389 Activity, other specified: Secondary | ICD-10-CM | POA: Insufficient documentation

## 2014-01-30 DIAGNOSIS — T300 Burn of unspecified body region, unspecified degree: Secondary | ICD-10-CM

## 2014-01-30 DIAGNOSIS — T24019A Burn of unspecified degree of unspecified thigh, initial encounter: Secondary | ICD-10-CM | POA: Insufficient documentation

## 2014-01-30 DIAGNOSIS — Y929 Unspecified place or not applicable: Secondary | ICD-10-CM | POA: Insufficient documentation

## 2014-01-30 DIAGNOSIS — X12XXXA Contact with other hot fluids, initial encounter: Secondary | ICD-10-CM | POA: Insufficient documentation

## 2014-01-30 DIAGNOSIS — X131XXA Other contact with steam and other hot vapors, initial encounter: Secondary | ICD-10-CM

## 2014-01-30 NOTE — ED Provider Notes (Signed)
Medical screening examination/treatment/procedure(s) were performed by non-physician practitioner and as supervising physician I was immediately available for consultation/collaboration.  Flint MelterElliott L Kimberley Dastrup, MD 01/30/14 [phone]
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
## 2014-01-30 NOTE — Discharge Instructions (Signed)
Please read and follow all provided instructions.  Your diagnoses today include:  1. Burn     Tests performed today include:  Vital signs. See below for your results today.   Medications prescribed:   Ibuprofen (Motrin, Advil) - anti-inflammatory pain medication  Do not exceed 600mg  ibuprofen every 6 hours, take with food  You have been prescribed an anti-inflammatory medication or NSAID. Take with food. Take smallest effective dose for the shortest duration needed for your pain. Stop taking if you experience stomach pain or vomiting.   Home care instructions:  Follow any educational materials contained in this packet.  Use topical antibiotic ointment as needed.   Follow-up instructions: Please follow-up with your primary care provider as needed for further evaluation of your symptoms.  If you do not have a primary care doctor -- see below for referral information.   Return instructions:   Please return to the Emergency Department if you experience worsening symptoms.   Please return if you have any other emergent concerns.  Additional Information:  Your vital signs today were: BP 120/75   Pulse 85   Temp(Src) 97.7 F (36.5 C) (Oral)   Resp 16   Ht 5\' 4"  (1.626 m)   Wt 160 lb (72.576 kg)   BMI 27.45 kg/m2   SpO2 99%   LMP 01/16/2014 If your blood pressure (BP) was elevated above 135/85 this visit, please have this repeated by your doctor within one month. ---------------

## 2014-01-30 NOTE — ED Notes (Signed)
Patient states spilled a "pot of coffee" on her R thigh.  Patient states it happened about 815 this morning.   Patient complains of leg being red and swollen.

## 2014-10-14 ENCOUNTER — Encounter (HOSPITAL_COMMUNITY): Payer: Self-pay | Admitting: Emergency Medicine

## 2014-10-14 ENCOUNTER — Emergency Department (HOSPITAL_COMMUNITY)
Admission: EM | Admit: 2014-10-14 | Discharge: 2014-10-14 | Disposition: A | Discharge status: Home / Self Care | Payer: Self-pay | Attending: Emergency Medicine | Admitting: Emergency Medicine

## 2014-10-14 DIAGNOSIS — Z880 Allergy status to penicillin: Secondary | ICD-10-CM | POA: Insufficient documentation

## 2014-10-14 DIAGNOSIS — J029 Acute pharyngitis, unspecified: Secondary | ICD-10-CM | POA: Insufficient documentation

## 2014-10-14 LAB — RAPID STREP SCREEN (MED CTR MEBANE ONLY): Streptococcus, Group A Screen (Direct): NEGATIVE

## 2014-10-14 NOTE — ED Notes (Signed)
Per pt, states left ear ache that started yesterday, increased discomfort-states also has headache that started this am

## 2014-10-14 NOTE — ED Provider Notes (Signed)
CSN: [id number]     Arrival date & time 10/14/14  0847 History   First MD Initiated Contact with Patient 10/14/14 [phone]     Chief Complaint  Patient presents with  . Otalgia   Patient is a 23 y.o. female presenting with ear pain.  Otalgia Associated symptoms: cough, headaches and sore throat   Associated symptoms: no ear discharge and no fever     Ms. Heather Joyce is a 23 year old female who presents with L ear pain that started yesterday. Pain is associated with a runny nose, headache, sore throat, cough but she denies fever, ear discharge, sick contacts. She reports that as a child, she was told she would need her tonsils taken out at some point.  Past Medical History  Diagnosis Date  . 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 Dental surgery     No family history on file. History  Substance Use Topics  . Smoking status: Never Smoker   . Smokeless tobacco: Never Used  . Alcohol Use: No   OB History    No data available     Review of Systems  Constitutional: Negative for fever.  HENT: Positive for ear pain and sore throat. Negative for ear discharge and trouble swallowing.   Respiratory: Positive for cough.   Neurological: Positive for headaches.      Allergies  Peanut-containing drug products; Penicillins; and Pineapple  Home Medications   Prior to Admission medications   Medication Sig Start Date End Date Taking? Authorizing Provider  acetaminophen (TYLENOL) 500 MG tablet Take 1,000 mg by mouth every 6 (six) hours as needed for headache.   Yes Historical Provider, MD   BP 119/69 mmHg  Pulse 76  Temp(Src) 98.9 F (37.2 C) (Oral)  Resp 20  SpO2 98%  LMP 09/18/2014 Physical Exam  Constitutional: She is oriented to person, place, and time. She appears well-developed and well-nourished. No distress.  HENT:  Head: Normocephalic and atraumatic.  Mouth/Throat: Oropharynx is clear and moist. Mucous membranes are not dry. No oropharyngeal  exudate.  L ear with some erythema of ear canal as compared to R ear. Uvular deviation to the right though tonsils symmetric bilaterally.  Neck: No tracheal deviation present.  Cardiovascular: Normal rate, regular rhythm and normal heart sounds.  Exam reveals no gallop and no friction rub.   No murmur heard. Pulmonary/Chest: Effort normal and breath sounds normal. No stridor. No respiratory distress. She has no wheezes. She has no rales.  Abdominal: Soft. Bowel sounds are normal. She exhibits no distension. There is no tenderness. There is no rebound.  Lymphadenopathy:    She has no cervical adenopathy.  Neurological: She is alert and oriented to person, place, and time.  Skin: She is not diaphoretic.    ED Course  Procedures (including critical care time) Labs Review Labs Reviewed  RAPID STREP SCREEN  CULTURE, GROUP A STREP    Imaging Review No results found.   EKG Interpretation None      MDM   Final diagnoses:  Viral pharyngitis    Viral pharyngitis is likely. Physical exam findings reassuring to r/o peritonsillar abscess though difficult to assess tonsils. She does not meet the [facility] Criteria but will check rapid Strep to r/o strep throat.  1008AM: Rapid strep negative. Stable for discharge.  Heywood Ilesushil Sharnice Bosler, MD 10/14/14 1009

## 2014-10-14 NOTE — Discharge Instructions (Signed)

## 2014-10-14 NOTE — ED Provider Notes (Signed)
I saw and evaluated the patient, reviewed the resident's note and I agree with the findings and plan.   EKG Interpretation None     Patient here complaining of URI symptoms including sore throat and left ear pain. Left TM is normal. Posterior pharynx is erythematous without evidence of peritonsillar abscess. Will check rapid strep  Toy BakerAnthony T Cosima Prentiss, MD 10/14/14 [phone]

## 2014-10-16 LAB — CULTURE, GROUP A STREP

## 2015-03-07 ENCOUNTER — Encounter (HOSPITAL_COMMUNITY): Payer: Self-pay | Admitting: Emergency Medicine

## 2015-03-07 ENCOUNTER — Emergency Department (HOSPITAL_COMMUNITY)
Admission: EM | Admit: 2015-03-07 | Discharge: 2015-03-07 | Disposition: A | Discharge status: Home / Self Care | Payer: Self-pay | Attending: Emergency Medicine | Admitting: Emergency Medicine

## 2015-03-07 DIAGNOSIS — L239 Allergic contact dermatitis, unspecified cause: Secondary | ICD-10-CM | POA: Insufficient documentation

## 2015-03-07 DIAGNOSIS — Z880 Allergy status to penicillin: Secondary | ICD-10-CM | POA: Insufficient documentation

## 2015-03-07 MED ORDER — HYDROXYZINE HCL 10 MG PO TABS: 10.0000 mg | ORAL_TABLET | Freq: Three times a day (TID) | ORAL | Status: DC | PRN

## 2015-03-07 MED ORDER — PERMETHRIN 5 % EX CREA
TOPICAL_CREAM | Freq: Once | CUTANEOUS | Status: AC
  Administered 2015-03-07: 1 via TOPICAL
  Filled 2015-03-07: qty 60

## 2015-03-07 MED ORDER — HYDROXYZINE HCL 10 MG PO TABS
10.0000 mg | ORAL_TABLET | Freq: Once | ORAL | Status: AC
  Administered 2015-03-07: 10 mg via ORAL
  Filled 2015-03-07: qty 1

## 2015-03-07 NOTE — ED Notes (Signed)
Pt c/o extensive red rash that covers bilateral arms and some on back. States it is spreading to legs.  Denies pain but is complaining of itchiness.

## 2015-03-07 NOTE — ED Provider Notes (Signed)
CSN: [id number]     Arrival date & time 03/07/15  [id number] History   First MD Initiated Contact with Patient 03/07/15 [phone]     Chief Complaint  Patient presents with  . Rash     HPI  Patient presents with concern of rash. Rash began yesterday, after the patient slept at a relative's house. Since onset rashes itchy, diffuse, uncomfortable, with no fever, chills, nausea, vomiting. No relief with Benadryl. No difficulty breathing, speaking. Patient was well prior to the onset of symptoms.  Past Medical History  Diagnosis Date  . 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 Dental surgery     No family history on file. History  Substance Use Topics  . Smoking status: Never Smoker   . Smokeless tobacco: Never Used  . Alcohol Use: No   OB History    No data available     Review of Systems  Constitutional: Negative for fever and chills.  Gastrointestinal: Negative for nausea.  Musculoskeletal: Negative.   Skin: Positive for rash.      Allergies  Peanut-containing drug products; Penicillins; and Pineapple  Home Medications   Prior to Admission medications   Medication Sig Start Date End Date Taking? Authorizing Provider  acetaminophen (TYLENOL) 500 MG tablet Take 1,000 mg by mouth every 6 (six) hours as needed for headache.   Yes Historical Provider, MD  hydrOXYzine (ATARAX/VISTARIL) 10 MG tablet Take 1 tablet (10 mg total) by mouth 3 (three) times daily as needed for itching. 03/07/15   Gerhard Munchobert Porcia Morganti, MD   BP 128/68 mmHg  Pulse 82  Temp(Src) 98.9 F (37.2 C) (Oral)  Resp 16  SpO2 100% Physical Exam  Constitutional: She appears well-developed and well-nourished. No distress.  HENT:  Head: Normocephalic and atraumatic.  Eyes: Conjunctivae are normal. Right eye exhibits no discharge. Left eye exhibits no discharge.  Neck: No tracheal deviation present.  Pulmonary/Chest: No stridor.  Neurological: She is alert. No cranial nerve deficit.  She exhibits normal muscle tone. Coordination normal.  Skin: She is not diaphoretic.  Diffuse, erythematous papular rash throughout the posterior of both UE, the ankles, minimally on the face. No confluence, no drainage, no bleeding  Psychiatric: She has a normal mood and affect.  Nursing note and vitals reviewed.   ED Course  Procedures (including critical care time)   MDM   Final diagnoses:  Allergic dermatitis    Patient presents with rash consistent with contact dermatitis, possibly from allergic reaction to bed but, scabies, mites. No evidence for systemic infection, respiratory compromise, bacteremia or sepsis. She started on topical therapy, discharged in stable condition.    Gerhard Munchobert Audray Rumore, MD 03/07/15 [phone]

## 2015-03-07 NOTE — Discharge Instructions (Signed)
As discussed, your rash is likely due to an allergic reaction to a small insects, possibly mites.  Please be sure to have all her bedding material cleaned thoroughly.  Use the provided medication as directed, return here for concerning changes in her condition.

## 2016-06-25 ENCOUNTER — Encounter (HOSPITAL_COMMUNITY): Payer: Self-pay | Admitting: Emergency Medicine

## 2016-06-25 ENCOUNTER — Emergency Department (HOSPITAL_COMMUNITY)
Admission: EM | Admit: 2016-06-25 | Discharge: 2016-06-25 | Disposition: A | Discharge status: Home / Self Care | Payer: Self-pay | Attending: Emergency Medicine | Admitting: Emergency Medicine

## 2016-06-25 DIAGNOSIS — R112 Nausea with vomiting, unspecified: Secondary | ICD-10-CM | POA: Insufficient documentation

## 2016-06-25 LAB — CBC
HCT: 42.7 % (ref 36.0–46.0)
HEMOGLOBIN: 14.5 g/dL (ref 12.0–15.0)
MCH: 30 pg (ref 26.0–34.0)
MCHC: 34 g/dL (ref 30.0–36.0)
MCV: 88.4 fL (ref 78.0–100.0)
Platelets: 378 10*3/uL (ref 150–400)
RBC: 4.83 MIL/uL (ref 3.87–5.11)
RDW: 13.5 % (ref 11.5–15.5)
WBC: 12.5 10*3/uL — AB (ref 4.0–10.5)

## 2016-06-25 LAB — COMPREHENSIVE METABOLIC PANEL
ALT: 14 U/L (ref 14–54)
ANION GAP: 8 (ref 5–15)
AST: 18 U/L (ref 15–41)
Albumin: 5 g/dL (ref 3.5–5.0)
Alkaline Phosphatase: 49 U/L (ref 38–126)
BUN: 8 mg/dL (ref 6–20)
CHLORIDE: 107 mmol/L (ref 101–111)
CO2: 24 mmol/L (ref 22–32)
Calcium: 9.6 mg/dL (ref 8.9–10.3)
Creatinine, Ser: 0.61 mg/dL (ref 0.44–1.00)
GFR calc non Af Amer: >60 mL/min (ref >60)
Glucose, Bld: 104 mg/dL — ABNORMAL HIGH (ref 65–99)
POTASSIUM: 3.6 mmol/L (ref 3.5–5.1)
SODIUM: 139 mmol/L (ref 135–145)
Total Bilirubin: 0.6 mg/dL (ref 0.3–1.2)
Total Protein: 8.6 g/dL — ABNORMAL HIGH (ref 6.5–8.1)

## 2016-06-25 LAB — URINALYSIS, ROUTINE W REFLEX MICROSCOPIC
Bilirubin Urine: NEGATIVE
GLUCOSE, UA: NEGATIVE mg/dL
HGB URINE DIPSTICK: NEGATIVE
Ketones, ur: NEGATIVE mg/dL
LEUKOCYTES UA: NEGATIVE
Nitrite: NEGATIVE
Protein, ur: 100 mg/dL — AB
SPECIFIC GRAVITY, URINE: 1.03 (ref 1.005–1.030)
pH: 8.5 — ABNORMAL HIGH (ref 5.0–8.0)

## 2016-06-25 LAB — URINE MICROSCOPIC-ADD ON

## 2016-06-25 LAB — HCG, QUANTITATIVE, PREGNANCY: hCG, Beta Chain, Quant, S: <1 m[IU]/mL (ref <5)

## 2016-06-25 LAB — LIPASE, BLOOD: LIPASE: 27 U/L (ref 11–51)

## 2016-06-25 MED ORDER — ONDANSETRON HCL 4 MG PO TABS: 4.0000 mg | ORAL_TABLET | Freq: Four times a day (QID) | ORAL | 0 refills | Status: DC

## 2016-06-25 MED ORDER — ONDANSETRON HCL 4 MG/2ML IJ SOLN
4.0000 mg | Freq: Once | INTRAMUSCULAR | Status: AC | PRN
  Administered 2016-06-25: 4 mg via INTRAVENOUS
  Filled 2016-06-25: qty 2

## 2016-06-25 NOTE — Discharge Instructions (Signed)

## 2016-06-25 NOTE — ED Provider Notes (Signed)
Emergency Department Provider Note   I have reviewed the triage vital signs and the nursing notes.   HISTORY  Chief Complaint Emesis   HPI Heather Joyce is a 25 y.o. female with no significant PMH presents to the emergency room in for evaluation of nausea and vomiting 4 hours. Patient felt somewhat nauseated 2 days ago but vomiting developed recently. No fever. No associated diarrhea. The patient has had no associated chest discomfort or difficulty breathing. She reports her last missed her period was 5 weeks ago which is a little late for her. She's had no vaginal bleeding or discharge. Patient is sexually active. No abdominal discomfort. She is not taken any medication for vomiting. Reports it is made worse with eating.   Past Medical History:  Diagnosis Date  . 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   Past Surgical History:  Procedure Laterality Date  . DENTAL SURGERY      Current Outpatient Rx  . Order #: [id number] Class: Historical Med  . Order #: [id number] Class: Print  . Order #: [id number] Class: Print    Allergies Peanut-containing drug products; Penicillins; and Pineapple  History reviewed. No pertinent family history.  Social History Social History  Substance Use Topics  . Smoking status: Never Smoker  . Smokeless tobacco: Never Used  . Alcohol use No    Review of Systems  Constitutional: No fever/chills Eyes: No visual changes. ENT: No sore throat. Cardiovascular: Denies chest pain. Respiratory: Denies shortness of breath. Gastrointestinal: No abdominal pain. Positive nausea/vomiting.  No diarrhea.  No constipation. Genitourinary: Negative for dysuria. Musculoskeletal: Negative for back pain. Skin: Negative for rash. Neurological: Negative for headaches, focal weakness or numbness.  10-point ROS otherwise negative.  ____________________________________________   PHYSICAL EXAM:  VITAL SIGNS: ED  Triage Vitals [06/25/16 1300]  Enc Vitals Group     BP 120/91     Pulse Rate 83     Resp 14     Temp 98.1 F (36.7 C)     Temp Source Oral     SpO2 95 %     Weight 141 lb 12.8 oz (64.3 kg)     Height 5\' 4"  (1.626 m)    Constitutional: Alert and oriented. Well appearing and in no acute distress. Eyes: Conjunctivae are normal.  Head: Atraumatic. Nose: No congestion/rhinnorhea. Mouth/Throat: Mucous membranes are moist.  Oropharynx non-erythematous. Neck: No stridor.   Cardiovascular: Normal rate, regular rhythm. Good peripheral circulation. Grossly normal heart sounds.   Respiratory: Normal respiratory effort.  No retractions. Lungs CTAB. Gastrointestinal: Soft and nontender. No distention.  Musculoskeletal: No lower extremity tenderness nor edema. No gross deformities of extremities. Neurologic:  Normal speech and language. No gross focal neurologic deficits are appreciated.  Skin:  Skin is warm, dry and intact. No rash noted. Psychiatric: Mood and affect are normal. Speech and behavior are normal.  ____________________________________________   LABS (all labs ordered are listed, but only abnormal results are displayed)  Labs Reviewed  COMPREHENSIVE METABOLIC PANEL - Abnormal; Notable for the following:       Result Value   Glucose, Bld 104 (*)    Total Protein 8.6 (*)    All other components within normal limits  CBC - Abnormal; Notable for the following:    WBC 12.5 (*)    All other components within normal limits  URINALYSIS, ROUTINE W REFLEX MICROSCOPIC (NOT AT [facility]) - Abnormal; Notable for the following:    APPearance CLOUDY (*)  pH 8.5 (*)    Protein, ur 100 (*)    All other components within normal limits  URINE MICROSCOPIC-ADD ON - Abnormal; Notable for the following:    Squamous Epithelial / LPF 6-30 (*)    Bacteria, UA RARE (*)    All other components within normal limits  LIPASE, BLOOD  HCG, QUANTITATIVE, PREGNANCY    ____________________________________________   PROCEDURES  Procedure(s) performed:   Procedures  None ____________________________________________   INITIAL IMPRESSION / ASSESSMENT AND PLAN / ED COURSE  Pertinent labs & imaging results that were available during my care of the patient were reviewed by me and considered in my medical decision making (see chart for details).  Patient resents the emergency department for evaluation of nausea and vomiting. No associated abdominal discomfort. No fevers. She has a benign abdominal exam. It is slightly late for her menstrual period. Plan for by mouth challenge after Zofran, basic labs, and pregnancy test.   03:17 PM Patient is tolerating PO. Lab testing including pregnancy is unremarkable. Plan for discharge with Zofran and strong return precautions. Discussed my impression and plan for discharge in detail with the patient who is pleased at discharge.   At this time, I do not feel there is any life-threatening condition present. I have reviewed and discussed all results (EKG, imaging, lab, urine as appropriate), exam findings with patient. I have reviewed nursing notes and appropriate previous records.  I feel the patient is safe to be discharged home without further emergent workup. Discussed usual and customary return precautions. Patient and family (if present) verbalize understanding and are comfortable with this plan.  Patient will follow-up with their primary care provider. If they do not have a primary care provider, information for follow-up has been provided to them. All questions have been answered.  ____________________________________________  FINAL CLINICAL IMPRESSION(S) / ED DIAGNOSES  Final diagnoses:  Non-intractable vomiting with nausea, vomiting of unspecified type     MEDICATIONS GIVEN DURING THIS VISIT:  Medications  ondansetron (ZOFRAN) injection 4 mg (4 mg Intravenous Given 06/25/16 1329)     NEW OUTPATIENT  MEDICATIONS STARTED DURING THIS VISIT:  New Prescriptions   ONDANSETRON (ZOFRAN) 4 MG TABLET    Take 1 tablet (4 mg total) by mouth every 6 (six) hours.      Note:  This document was prepared using Dragon voice recognition software and may include unintentional dictation errors.  Alona Bene, MD Emergency Medicine   Maia Plan, MD 06/25/16 [phone]

## 2016-06-25 NOTE — ED Triage Notes (Signed)
Pt c/o nausea onset Friday, emesis onset yesterday. Pt currently vomiting. No abdominal pain. Pt late for menstrual period.

## 2016-06-25 NOTE — ED Notes (Signed)
PT TOLERATED SALTINE CRACKERS AND WATER.

## 2016-06-28 ENCOUNTER — Emergency Department (HOSPITAL_COMMUNITY)
Admission: EM | Admit: 2016-06-28 | Discharge: 2016-06-29 | Disposition: A | Discharge status: Home / Self Care | Payer: Medicaid Other | Attending: Emergency Medicine | Admitting: Emergency Medicine

## 2016-06-28 DIAGNOSIS — R112 Nausea with vomiting, unspecified: Secondary | ICD-10-CM

## 2016-06-28 LAB — URINALYSIS, ROUTINE W REFLEX MICROSCOPIC
BILIRUBIN URINE: NEGATIVE
GLUCOSE, UA: NEGATIVE mg/dL
Hgb urine dipstick: NEGATIVE
KETONES UR: NEGATIVE mg/dL
LEUKOCYTES UA: NEGATIVE
Nitrite: NEGATIVE
PROTEIN: NEGATIVE mg/dL
Specific Gravity, Urine: 1.024 (ref 1.005–1.030)
pH: 7.5 (ref 5.0–8.0)

## 2016-06-28 LAB — CBC
HCT: 40.3 % (ref 36.0–46.0)
Hemoglobin: 13.9 g/dL (ref 12.0–15.0)
MCH: 30.1 pg (ref 26.0–34.0)
MCHC: 34.5 g/dL (ref 30.0–36.0)
MCV: 87.2 fL (ref 78.0–100.0)
PLATELETS: 370 10*3/uL (ref 150–400)
RBC: 4.62 MIL/uL (ref 3.87–5.11)
RDW: 13.2 % (ref 11.5–15.5)
WBC: 9.2 10*3/uL (ref 4.0–10.5)

## 2016-06-28 MED ORDER — SODIUM CHLORIDE 0.9 % IV BOLUS (SEPSIS)
1000.0000 mL | Freq: Once | INTRAVENOUS | Status: AC
  Administered 2016-06-29: 1000 mL via INTRAVENOUS

## 2016-06-28 MED ORDER — HALOPERIDOL LACTATE 5 MG/ML IJ SOLN
2.0000 mg | Freq: Once | INTRAMUSCULAR | Status: AC
  Administered 2016-06-29: 2 mg via INTRAVENOUS
  Filled 2016-06-28: qty 1

## 2016-06-28 MED ORDER — FAMOTIDINE IN NACL 20-0.9 MG/50ML-% IV SOLN
20.0000 mg | Freq: Once | INTRAVENOUS | Status: AC
  Administered 2016-06-29: 20 mg via INTRAVENOUS
  Filled 2016-06-28: qty 50

## 2016-06-28 MED ORDER — ONDANSETRON 4 MG PO TBDP
4.0000 mg | ORAL_TABLET | Freq: Once | ORAL | Status: AC | PRN
  Administered 2016-06-28: 4 mg via ORAL
  Filled 2016-06-28: qty 1

## 2016-06-28 NOTE — ED Provider Notes (Signed)
[facility]-EMERGENCY DEPT Provider Note   CSN: [id number] Arrival date & time: 06/28/16  2313 By signing my name below, I, Heather Joyce, attest that this documentation has been prepared under the direction and in the presence of non-physician practitioner, Antony Madura, PA-C  Electronically Signed: Levon Joyce, Scribe. 06/29/2016. 12:01 AM.   History   Chief Complaint Chief Complaint  Patient presents with  . Emesis    HPI Heather Joyce is a 25 y.o. female who presents to the Emergency Department complaining of continuing nausea and vomiting which began 4 days ago. She was seen in the ED for the same 4 days ago, but symptoms have not resolved. She was prescribed Zofran, but could not afford the medication. She notes associated sharp abdominal pain last night. No alleviating or modifying factors noted. Pt states she last smoked marijuana five days ago. Per pt, she has never experienced these symptoms after smoking before. Pt states she has not had her menstrual period this month. She denies fever. No hx of abdominal surgeries.  The history is provided by the patient. No language interpreter was used.    Past Medical History:  Diagnosis Date  . 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   Past Surgical History:  Procedure Laterality Date  . DENTAL SURGERY      OB History    No data available       Home Medications    Prior to Admission medications   Medication Sig Start Date End Date Taking? Authorizing Provider  acetaminophen (TYLENOL) 500 MG tablet Take 1,000 mg by mouth every 6 (six) hours as needed for headache.    Historical Provider, MD  hydrOXYzine (ATARAX/VISTARIL) 10 MG tablet Take 1 tablet (10 mg total) by mouth 3 (three) times daily as needed for itching. 03/07/15   Gerhard Munch, MD  ondansetron (ZOFRAN) 4 MG tablet Take 1 tablet (4 mg total) by mouth every 6 (six) hours. 06/25/16   Maia Plan, MD    Family History No  family history on file.  Social History Social History  Substance Use Topics  . Smoking status: Never Smoker  . Smokeless tobacco: Never Used  . Alcohol use No     Allergies   Peanut-containing drug products; Penicillins; and Pineapple   Review of Systems Review of Systems  Constitutional: Negative for fever.  Gastrointestinal: Positive for abdominal pain, nausea and vomiting.  Ten systems reviewed and are negative for acute change, except as noted in the HPI.    Physical Exam Updated Vital Signs BP (!) 136/108 (BP Location: Left Arm)   Pulse 75   Temp 99 F (37.2 C) (Oral)   Resp 18   Ht 5\' 4"  (1.626 m)   Wt 141 lb (64 kg)   LMP 05/18/2016   SpO2 99%   BMI 24.20 kg/m   Physical Exam  Constitutional: She is oriented to person, place, and time. She appears well-developed and well-nourished. No distress.  Nontoxic appearing; in NAD  HENT:  Head: Normocephalic and atraumatic.  Eyes: Conjunctivae and EOM are normal. No scleral icterus.  Neck: Normal range of motion.  Cardiovascular: Normal rate, regular rhythm and intact distal pulses.   Pulmonary/Chest: Effort normal. No respiratory distress. She has no wheezes. She has no rales.  Respirations even and unlabored. Lungs CTAB.  Abdominal: Soft. She exhibits no distension and no mass. There is no rebound and no guarding.  Soft abdomen without focal TTP. No masses  or rigidity. No guarding or peritoneal signs.  Musculoskeletal: Normal range of motion.  Neurological: She is alert and oriented to person, place, and time.  GCS 15. Patient moving all extremities.  Skin: Skin is warm and dry. No rash noted. She is not diaphoretic. No erythema. No pallor.  Psychiatric: She has a normal mood and affect. Her behavior is normal.  Nursing note and vitals reviewed.    ED Treatments / Results  DIAGNOSTIC STUDIES:  Oxygen Saturation is 99% on RA, normal by my interpretation.    COORDINATION OF CARE:  11:54 PM Discussed  treatment plan which includes Haldol and pepcid with pt at bedside and pt agreed to plan.   Labs (all labs ordered are listed, but only abnormal results are displayed) Labs Reviewed  LIPASE, BLOOD  COMPREHENSIVE METABOLIC PANEL  CBC  URINALYSIS, ROUTINE W REFLEX MICROSCOPIC (NOT AT [facility])    EKG  EKG Interpretation None       Radiology No results found.  Procedures Procedures (including critical care time)  Medications Ordered in ED Medications  ondansetron (ZOFRAN-ODT) disintegrating tablet 4 mg (4 mg Oral Given 06/28/16 2322)  haloperidol lactate (HALDOL) injection 2 mg (2 mg Intravenous Given 06/29/16 0017)  sodium chloride 0.9 % bolus 1,000 mL (0 mLs Intravenous Stopped 06/29/16 0145)  famotidine (PEPCID) IVPB 20 mg premix (0 mg Intravenous Stopped 06/29/16 0110)     Initial Impression / Assessment and Plan / ED Course  I have reviewed the triage vital signs and the nursing notes.  Pertinent labs & imaging results that were available during my care of the patient were reviewed by me and considered in my medical decision making (see chart for details).  Clinical Course    25 year old female presents to the emergency department for evaluation of persistent nausea and vomiting. She was evaluated 4 days ago for similar symptoms. She states that she never had the Zofran prescription filled because the medication was too costly. She does report marijuana use the day before onset of her nausea and emesis. She denies sick contacts.  Patient without focal tenderness or evidence of acute surgical abdomen on exam. She has no focal right upper quadrant tenderness to suspect gallbladder etiology. Doubt SBO or pSBO as patient has been passing flatus and having regular bowel movements. She has a reassuring laboratory workup today with no leukocytosis. Liver and kidney function preserved. [name] is negative. No evidence of UTI.  Symptoms managed with Haldol and Pepcid. Patient also given 1  L of IV fluids. On reexamination, she reports complete resolution of her nausea. She has no complaints of abdominal pain. Suspect viral etiology versus cannabis related emesis. I do not believe further emergent workup is indicated at this time. Patient to be discharged with outpatient course of Phenergan to take as needed. Return precautions discussed and provided. Patient discharged in satisfactory condition with no unaddressed concerns.   Vitals:   06/28/16 2316 06/29/16 0145  BP: (!) 136/108 125/65  Pulse: 75 86  Resp: 18 18  Temp: 99 F (37.2 C) 98.4 F (36.9 C)  TempSrc: Oral Oral  SpO2: 99% 97%  Weight: 64 kg   Height: 5\' 4"  (1.626 m)     Final Clinical Impressions(s) / ED Diagnoses   Final diagnoses:  Non-intractable vomiting with nausea, vomiting of unspecified type    I personally performed the services described in this documentation, which was scribed in my presence. The recorded information has been reviewed and is accurate.    New Prescriptions New  Prescriptions   PROMETHAZINE (PHENERGAN) 25 MG TABLET    Take 1 tablet (25 mg total) by mouth every 6 (six) hours as needed for nausea or vomiting.     Antony MaduraKelly Veria Stradley, PA-C 06/29/16 [id number]    April Palumbo, MD 06/29/16 [phone]

## 2016-06-28 NOTE — ED Triage Notes (Addendum)
Pt presents via EMS for continuing N/V since Friday. Seen here but symptoms have not resolved. Sent home w/ zofran but could not afford it. Alert and oriented.

## 2016-06-29 LAB — URINE MICROSCOPIC-ADD ON: RBC / HPF: NONE SEEN RBC/hpf (ref 0–5)

## 2016-06-29 LAB — COMPREHENSIVE METABOLIC PANEL
ALK PHOS: 51 U/L (ref 38–126)
ALT: 12 U/L — AB (ref 14–54)
ANION GAP: 7 (ref 5–15)
AST: 18 U/L (ref 15–41)
Albumin: 4.9 g/dL (ref 3.5–5.0)
BILIRUBIN TOTAL: 0.9 mg/dL (ref 0.3–1.2)
BUN: 8 mg/dL (ref 6–20)
CALCIUM: 10 mg/dL (ref 8.9–10.3)
CO2: 25 mmol/L (ref 22–32)
CREATININE: 0.69 mg/dL (ref 0.44–1.00)
Chloride: 107 mmol/L (ref 101–111)
GFR calc non Af Amer: >60 mL/min (ref >60)
GLUCOSE: 104 mg/dL — AB (ref 65–99)
Potassium: 3.4 mmol/L — ABNORMAL LOW (ref 3.5–5.1)
Sodium: 139 mmol/L (ref 135–145)
TOTAL PROTEIN: 8.6 g/dL — AB (ref 6.5–8.1)

## 2016-06-29 LAB — PREGNANCY, URINE: Preg Test, Ur: NEGATIVE

## 2016-06-29 LAB — LIPASE, BLOOD: Lipase: 29 U/L (ref 11–51)

## 2016-06-29 MED ORDER — PROMETHAZINE HCL 25 MG PO TABS: 25.0000 mg | ORAL_TABLET | Freq: Four times a day (QID) | ORAL | 0 refills | Status: DC | PRN

## 2016-06-29 NOTE — ED Notes (Signed)
Pt verbalized understanding of d/c instructions and has no further questions. Pt is stable, A&Ox4, VSS, NAD 

## 2016-06-29 NOTE — ED Notes (Signed)
No respiratory or acute distress noted alert and oriented x 3 call light in reach no reaction to medication noted visitor at bedside. 

## 2016-08-06 ENCOUNTER — Encounter (HOSPITAL_COMMUNITY): Payer: Self-pay

## 2016-08-06 ENCOUNTER — Emergency Department (HOSPITAL_COMMUNITY)
Admission: EM | Admit: 2016-08-06 | Discharge: 2016-08-06 | Disposition: A | Discharge status: Home / Self Care | Payer: Medicaid Other | Attending: Emergency Medicine | Admitting: Emergency Medicine

## 2016-08-06 ENCOUNTER — Emergency Department (HOSPITAL_COMMUNITY): Payer: Medicaid Other

## 2016-08-06 DIAGNOSIS — Z331 Pregnant state, incidental: Secondary | ICD-10-CM | POA: Insufficient documentation

## 2016-08-06 DIAGNOSIS — R112 Nausea with vomiting, unspecified: Secondary | ICD-10-CM | POA: Insufficient documentation

## 2016-08-06 DIAGNOSIS — Z91010 Allergy to peanuts: Secondary | ICD-10-CM | POA: Insufficient documentation

## 2016-08-06 DIAGNOSIS — Z3201 Encounter for pregnancy test, result positive: Secondary | ICD-10-CM

## 2016-08-06 DIAGNOSIS — R197 Diarrhea, unspecified: Secondary | ICD-10-CM | POA: Insufficient documentation

## 2016-08-06 LAB — URINALYSIS, ROUTINE W REFLEX MICROSCOPIC
Glucose, UA: NEGATIVE mg/dL
Leukocytes, UA: NEGATIVE
NITRITE: NEGATIVE
PROTEIN: 100 mg/dL — AB
pH: 6.5 (ref 5.0–8.0)

## 2016-08-06 LAB — COMPREHENSIVE METABOLIC PANEL
ALK PHOS: 54 U/L (ref 38–126)
ALT: 19 U/L (ref 14–54)
AST: 24 U/L (ref 15–41)
Albumin: 4.7 g/dL (ref 3.5–5.0)
Anion gap: 14 (ref 5–15)
BILIRUBIN TOTAL: 0.7 mg/dL (ref 0.3–1.2)
BUN: 10 mg/dL (ref 6–20)
CALCIUM: 10.1 mg/dL (ref 8.9–10.3)
CO2: 22 mmol/L (ref 22–32)
CREATININE: 0.77 mg/dL (ref 0.44–1.00)
Chloride: 105 mmol/L (ref 101–111)
Glucose, Bld: 118 mg/dL — ABNORMAL HIGH (ref 65–99)
Potassium: 3.5 mmol/L (ref 3.5–5.1)
Sodium: 141 mmol/L (ref 135–145)
TOTAL PROTEIN: 8.4 g/dL — AB (ref 6.5–8.1)

## 2016-08-06 LAB — URINE MICROSCOPIC-ADD ON

## 2016-08-06 LAB — CBC
HCT: 43.1 % (ref 36.0–46.0)
HEMOGLOBIN: 14.7 g/dL (ref 12.0–15.0)
MCH: 30.3 pg (ref 26.0–34.0)
MCHC: 34.1 g/dL (ref 30.0–36.0)
MCV: 88.9 fL (ref 78.0–100.0)
PLATELETS: 322 10*3/uL (ref 150–400)
RBC: 4.85 MIL/uL (ref 3.87–5.11)
RDW: 12.8 % (ref 11.5–15.5)
WBC: 15.1 10*3/uL — ABNORMAL HIGH (ref 4.0–10.5)

## 2016-08-06 LAB — HCG, QUANTITATIVE, PREGNANCY: hCG, Beta Chain, Quant, S: 21 m[IU]/mL — ABNORMAL HIGH (ref <5)

## 2016-08-06 LAB — I-STAT BETA HCG BLOOD, ED (MC, WL, AP ONLY): HCG, QUANTITATIVE: 17.4 m[IU]/mL — AB (ref <5)

## 2016-08-06 LAB — LIPASE, BLOOD: LIPASE: 27 U/L (ref 11–51)

## 2016-08-06 MED ORDER — ONDANSETRON HCL 4 MG/2ML IJ SOLN
4.0000 mg | Freq: Once | INTRAMUSCULAR | Status: AC
  Administered 2016-08-06: 4 mg via INTRAVENOUS
  Filled 2016-08-06: qty 2

## 2016-08-06 MED ORDER — SODIUM CHLORIDE 0.9 % IV BOLUS (SEPSIS)
1000.0000 mL | Freq: Once | INTRAVENOUS | Status: AC
  Administered 2016-08-06: 1000 mL via INTRAVENOUS

## 2016-08-06 MED ORDER — ONDANSETRON 4 MG PO TBDP
4.0000 mg | ORAL_TABLET | Freq: Once | ORAL | Status: AC | PRN
  Administered 2016-08-06: 4 mg via ORAL

## 2016-08-06 MED ORDER — ONDANSETRON 4 MG PO TBDP: 4.0000 mg | ORAL_TABLET | Freq: Three times a day (TID) | ORAL | 0 refills | Status: DC | PRN

## 2016-08-06 MED ORDER — ONDANSETRON 4 MG PO TBDP
ORAL_TABLET | ORAL | Status: AC
  Filled 2016-08-06: qty 1

## 2016-08-06 NOTE — ED Notes (Signed)
Pt tolerating PO fluids well. Showing NAD.

## 2016-08-06 NOTE — ED Notes (Signed)
Pt given po fluids per EDP verbal order. Tolerating well.

## 2016-08-06 NOTE — ED Notes (Signed)
Pt back in room.

## 2016-08-06 NOTE — Discharge Instructions (Signed)
It is likely that your symptoms today are due to a viral illness. We recommend that you take Zofran as needed for persistent nausea and vomiting. Zofran is safe to take in pregnancy as you are found to have a positive pregnancy test while in the emergency department. Because your pregnancy test suggests that you are less than [redacted] week pregnant, we advise that [facility] to have this test rechecked. Return for new or concerning symptoms.

## 2016-08-06 NOTE — ED Triage Notes (Signed)
Patient here with abdominal cramping vomiting and diarrhea since 10pm. States started after eating dominos last night. Patient states she feels fatigued due to the vomiting

## 2016-08-06 NOTE — ED Notes (Signed)
Pt showing NAD. RR even and unlabored. Voices no questions/concerns at this time. 

## 2016-08-06 NOTE — ED Provider Notes (Signed)
[facility]-EM[facility] DEPT Provider Note   CSN: [id number] Arrival date & time: 08/06/16  1659    History   Chief Complaint Chief Complaint  Patient presents with  . Abdominal Pain  . Emesis    HPI Heather Joyce is a 25 y.o. female.  25 year old female presents to the emergency department for evaluation of vomiting and diarrhea. Patient states that vomiting began after eating [employer] last night. She reports too numerous to count episodes of emesis, all of which have been nonbloody. She did have multiple episodes of watery diarrhea. She reports 1 episode with a small amount of blood; however, she has had a subsequent episode while in the emergency department which was nonbloody. She denies any abdominal pain at this time. She has not taken any medications prior to arrival. She reports similar symptoms back in August. Patient states that her boyfriend also ate [employer] and is not sick. She has had no fevers, dysuria, hematuria, vaginal bleeding or discharge. No history of abdominal surgeries.      Past Medical History:  Diagnosis Date  . 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   Past Surgical History:  Procedure Laterality Date  . DENTAL SURGERY      OB History    No data available       Home Medications    Prior to Admission medications   Medication Sig Start Date End Date Taking? Authorizing Provider  acetaminophen (TYLENOL) 500 MG tablet Take 1,000 mg by mouth every 6 (six) hours as needed for mild pain or headache.    Yes Historical Provider, MD  ondansetron (ZOFRAN ODT) 4 MG disintegrating tablet Take 1 tablet (4 mg total) by mouth every 8 (eight) hours as needed for nausea or vomiting. 08/06/16   Antony Madura, PA-C  promethazine (PHENERGAN) 25 MG tablet Take 1 tablet (25 mg total) by mouth every 6 (six) hours as needed for nausea or vomiting. Patient not taking: Reported on 08/06/2016 06/29/16   Antony Madura, PA-C    Family  History No family history on file.  Social History Social History  Substance Use Topics  . Smoking status: Never Smoker  . Smokeless tobacco: Never Used  . Alcohol use No     Allergies   Peanut-containing drug products; Penicillins; and Pineapple   Review of Systems Review of Systems Ten systems reviewed and are negative for acute change, except as noted in the HPI.    Physical Exam Updated Vital Signs BP 112/56 (BP Location: Right Arm)   Pulse 95   Temp 98.2 F (36.8 C) (Oral)   Resp 15   SpO2 100%   Physical Exam  Constitutional: She is oriented to person, place, and time. She appears well-developed and well-nourished. No distress.  Nontoxic appearing and in NAD  HENT:  Head: Normocephalic and atraumatic.  Eyes: Conjunctivae and EOM are normal. No scleral icterus.  Neck: Normal range of motion.  Cardiovascular: Normal rate, regular rhythm and intact distal pulses.   Pulmonary/Chest: Effort normal. No respiratory distress. She has no wheezes.  Respirations even and unlabored  Abdominal: Soft. She exhibits no distension and no mass. There is tenderness. There is no guarding.  Soft, nondistended abdomen with TTP in bilateral upper quadrants. Negative Murphy's sign. No peritoneal signs or masses.  Musculoskeletal: Normal range of motion.  Neurological: She is alert and oriented to person, place, and time. She exhibits normal muscle tone. Coordination normal.  Skin: Skin is warm and  dry. No rash noted. She is not diaphoretic. No erythema. No pallor.  Psychiatric: She has a normal mood and affect. Her behavior is normal.  Nursing note and vitals reviewed.    ED Treatments / Results  Labs (all labs ordered are listed, but only abnormal results are displayed) Labs Reviewed  COMPREHENSIVE METABOLIC PANEL - Abnormal; Notable for the following:       Result Value   Glucose, Bld 118 (*)    Total Protein 8.4 (*)    All other components within normal limits  CBC -  Abnormal; Notable for the following:    WBC 15.1 (*)    All other components within normal limits  URINALYSIS, ROUTINE W REFLEX MICROSCOPIC (NOT AT [facility]) - Abnormal; Notable for the following:    APPearance HAZY (*)    Specific Gravity, Urine >1.030 (*)    Hgb urine dipstick LARGE (*)    Bilirubin Urine SMALL (*)    Ketones, ur >80 (*)    Protein, ur 100 (*)    All other components within normal limits  HCG, QUANTITATIVE, PREGNANCY - Abnormal; Notable for the following:    hCG, Beta Chain, Quant, S 21 (*)    All other components within normal limits  URINE MICROSCOPIC-ADD ON - Abnormal; Notable for the following:    Squamous Epithelial / LPF 0-5 (*)    Bacteria, UA FEW (*)    All other components within normal limits  I-STAT BETA HCG BLOOD, ED ([facility], [facility], [facility] ONLY) - Abnormal; Notable for the following:    I-stat hCG, quantitative 17.4 (*)    All other components within normal limits  LIPASE, BLOOD    EKG  EKG Interpretation None       Radiology Koreas Abdomen Complete  Result Date: 08/06/2016 CLINICAL DATA:  Initial evaluation for acute abdominal pain with nausea and vomiting for 3 months. EXAM: ABDOMEN ULTRASOUND COMPLETE COMPARISON:  None. FINDINGS: Gallbladder: No shadowing echogenic gallstones or wall thickening visualized. Minimal gallbladder sludge/debris noted. No sonographic Murphy sign noted by sonographer. Common bile duct: Diameter: 3.6 mm Liver: No focal lesion identified. Within normal limits in parenchymal echogenicity. IVC: No abnormality visualized. Pancreas: Visualized portion unremarkable. Spleen: Size and appearance within normal limits. Right Kidney: Length: 11 cm. Echogenicity within normal limits. No mass or hydronephrosis visualized. Left Kidney: Length: 9.8 cm. Echogenicity within normal limits. No mass or hydronephrosis visualized. Abdominal aorta: No aneurysm visualized. Other findings: None. IMPRESSION: 1. Small amount of gallbladder sludge. No sonographic  evidence for acute cholecystitis or biliary dilatation. 2. Otherwise normal abdominal ultrasound. Electronically Signed   By: Rise MuBenjamin  McClintock M.D.   On: 08/06/2016 21:35    Procedures Procedures (including critical care time)  Medications Ordered in ED Medications  ondansetron (ZOFRAN-ODT) disintegrating tablet 4 mg (4 mg Oral Given 08/06/16 1719)  sodium chloride 0.9 % bolus 1,000 mL (0 mLs Intravenous Stopped 08/06/16 2207)  ondansetron (ZOFRAN) injection 4 mg (4 mg Intravenous Given 08/06/16 2204)     Initial Impression / Assessment and Plan / ED Course  I have reviewed the triage vital signs and the nursing notes.  Pertinent labs & imaging results that were available during my care of the patient were reviewed by me and considered in my medical decision making (see chart for details).  Clinical Course    25 year old female presents to the emergency department for evaluation of nausea and vomiting which began after eating [employer] pizza last night. She also reports watery diarrhea. Patient had similar symptoms approximately  1 month ago. She had some mild bilateral upper quadrant tenderness without evidence of acute surgical abdomen. Ultrasound was performed which showed gallbladder sludge without evidence of acute cholecystitis. This may be contributing to patient's symptoms. Patient able to tolerate oral fluids after antiemetics. On reassessment, she reports feeling much better.  Patient found to be incidentally pregnant. HCG 21. The patient has been instructed to follow-up with [facility] in 2 days for repeat hCG testing. No lower abdominal pain to suggest ectopic. No UTI. Symptoms today unlikely to be related to pregnancy. Return precautions discussed and provided. Patient discharged in stable condition with no unaddressed concerns.   Final Clinical Impressions(s) / ED Diagnoses   Final diagnoses:  Nausea vomiting and diarrhea  Positive pregnancy test    New  Prescriptions Discharge Medication List as of 08/06/2016 10:56 PM    START taking these medications   Details  ondansetron (ZOFRAN ODT) 4 MG disintegrating tablet Take 1 tablet (4 mg total) by mouth every 8 (eight) hours as needed for nausea or vomiting., Starting Sun 08/06/2016, Print         Antony Madura, PA-C 08/07/16 0981    Azalia Bilis, MD 08/07/16 0100

## 2016-08-08 ENCOUNTER — Encounter (HOSPITAL_COMMUNITY): Payer: Self-pay | Admitting: *Deleted

## 2016-08-08 ENCOUNTER — Inpatient Hospital Stay (HOSPITAL_COMMUNITY)
Admission: AD | Admit: 2016-08-08 | Discharge: 2016-08-08 | Disposition: A | Discharge status: Home / Self Care | Payer: Medicaid Other | Source: Ambulatory Visit | Attending: Obstetrics and Gynecology | Admitting: Obstetrics and Gynecology

## 2016-08-08 DIAGNOSIS — O039 Complete or unspecified spontaneous abortion without complication: Secondary | ICD-10-CM | POA: Insufficient documentation

## 2016-08-08 LAB — HCG, QUANTITATIVE, PREGNANCY: HCG, BETA CHAIN, QUANT, S: 6 m[IU]/mL — AB (ref <5)

## 2016-08-08 NOTE — MAU Note (Signed)
Pt came in for a follow-up HCG from an incidental finding at a visit from [place].  She states she started her period that day and is still bleeding some, no more or different than a regular period and no cramping.

## 2016-08-08 NOTE — MAU Provider Note (Signed)
Spoke with patient briefly in triage, she was here for a repeat B-hcg, [facility]. Bhcg then was 21. She is on her "period". Bhcg today is 6. Discussed likely miscarriage. Follow up in 1 week at [facility] for repeat bhcg and possible birth control. She does not desire pregnancy. Denies any abdominal pain, not currently using contraception.  Cleda ClarksElizabeth W. Omeed Osuna, DO  OB Fellow [facility]]

## 2016-08-15 ENCOUNTER — Telehealth: Payer: Self-pay | Admitting: *Deleted

## 2016-08-15 ENCOUNTER — Other Ambulatory Visit: Payer: Self-pay

## 2016-08-15 NOTE — Telephone Encounter (Signed)
Pt was scheduled to come in for repeat hcg level today. Originally listed as stat but upon chart review just needs to be a repeat level. Called patient to get her to come in and phone just rings. Last level was 6 on 08/08/16.

## 2016-10-30 NOTE — L&D Delivery Note (Signed)
Patient is 26 y.o. G2P0010 2362w6d admitted for IOL for gHTN. S/p IOL with foley bulb, cytotec, followed by Pitocin. AROM at 0651.  Prenatal course otherwise uncomplicated.  Delivery Note At 7:50 AM a viable female was delivered via Vaginal, Spontaneous Delivery (Presentation: cephalic; ROA).  APGAR: 7, 9; weight pending.   Placenta status: grossly normal, intact.  Cord: 3 vessels without complications. Cord pH: not drawn  Anesthesia: Epidural  Episiotomy: None Lacerations: None Suture Repair: none needed Est. Blood Loss (mL): 50  Mom to postpartum.  Baby to Couplet care / Skin to Skin.  Upon arrival patient was complete and pushing. She pushed with good maternal effort to deliver a viable female infant in cephalic, ROA position.  Nuchal cord,easily reduced. Baby delivered without difficulty (anterior shoulder delivered with traction in anterior axilla), was noted to have good tone and placed on maternal abdomen for oral suctioning, drying and stimulation. Delayed cord clamping performed. Placenta delivered spontaneously with gentle cord traction. Fundus firm with massage and Pitocin. Perineum inspected and found to have no lacerations. Counts of sharps, instruments, and lap pads were all correct.   Amanda C. Frances FurbishWinfrey, MD PGY-1, [facility] 07/05/2017 8:04 AM  Patient is a G1 at 5062w6d who was admitted for IOL due to gHTN, significant hx of uncomplicated prenatal course until the dx of gHTN yesterday.  She progressed with augmentation via cyto/FB/briefly Pit/AROM.  I was gloved and present for delivery in its entirety.  Second stage of labor progressed, baby delivered after a few contractions.  No decels during second stage noted.  Complications: none  Lacerations: none  EBL: 50cc  Cam HaiSHAW, KIMBERLY, CNM 8:32 AM 07/05/2017

## 2016-11-08 ENCOUNTER — Encounter (HOSPITAL_COMMUNITY): Payer: Self-pay | Admitting: Emergency Medicine

## 2016-11-08 ENCOUNTER — Emergency Department (HOSPITAL_COMMUNITY)
Admission: EM | Admit: 2016-11-08 | Discharge: 2016-11-08 | Disposition: A | Discharge status: Home / Self Care | Payer: Medicaid Other | Attending: Emergency Medicine | Admitting: Emergency Medicine

## 2016-11-08 DIAGNOSIS — O211 Hyperemesis gravidarum with metabolic disturbance: Secondary | ICD-10-CM | POA: Insufficient documentation

## 2016-11-08 DIAGNOSIS — O210 Mild hyperemesis gravidarum: Secondary | ICD-10-CM

## 2016-11-08 DIAGNOSIS — Z91018 Allergy to other foods: Secondary | ICD-10-CM | POA: Insufficient documentation

## 2016-11-08 DIAGNOSIS — Z79899 Other long term (current) drug therapy: Secondary | ICD-10-CM | POA: Insufficient documentation

## 2016-11-08 DIAGNOSIS — Z3A01 Less than 8 weeks gestation of pregnancy: Secondary | ICD-10-CM | POA: Insufficient documentation

## 2016-11-08 LAB — PREGNANCY, URINE: PREG TEST UR: POSITIVE — AB

## 2016-11-08 LAB — URINALYSIS, ROUTINE W REFLEX MICROSCOPIC
BACTERIA UA: NONE SEEN
Glucose, UA: NEGATIVE mg/dL
HGB URINE DIPSTICK: NEGATIVE
Ketones, ur: 80 mg/dL — AB
LEUKOCYTES UA: NEGATIVE
NITRITE: NEGATIVE
PH: 6 (ref 5.0–8.0)
Protein, ur: 100 mg/dL — AB
SPECIFIC GRAVITY, URINE: 1.035 — AB (ref 1.005–1.030)

## 2016-11-08 LAB — I-STAT CHEM 8, ED
BUN: 8 mg/dL (ref 6–20)
CALCIUM ION: 1.23 mmol/L (ref 1.15–1.40)
CREATININE: 0.5 mg/dL (ref 0.44–1.00)
Chloride: 104 mmol/L (ref 101–111)
Glucose, Bld: 98 mg/dL (ref 65–99)
HCT: 39 % (ref 36.0–46.0)
Hemoglobin: 13.3 g/dL (ref 12.0–15.0)
Potassium: 3.5 mmol/L (ref 3.5–5.1)
Sodium: 138 mmol/L (ref 135–145)
TCO2: 20 mmol/L (ref 0–100)

## 2016-11-08 MED ORDER — ONDANSETRON HCL 4 MG/2ML IJ SOLN
4.0000 mg | Freq: Once | INTRAMUSCULAR | Status: AC
  Administered 2016-11-08: 4 mg via INTRAVENOUS
  Filled 2016-11-08: qty 2

## 2016-11-08 MED ORDER — ONDANSETRON 8 MG PO TBDP: 8.0000 mg | ORAL_TABLET | Freq: Three times a day (TID) | ORAL | 0 refills | Status: DC | PRN

## 2016-11-08 MED ORDER — LACTATED RINGERS IV BOLUS (SEPSIS)
2000.0000 mL | Freq: Once | INTRAVENOUS | Status: AC
  Administered 2016-11-08: 2000 mL via INTRAVENOUS

## 2016-11-08 NOTE — ED Notes (Signed)
Patient was alert, oriented and stable upon discharge. RN went over AVS and patient had no further questions.  

## 2016-11-08 NOTE — ED Provider Notes (Signed)
[facility]-EMERGENCY DEPT Provider Note   CSN: [id number] Arrival date & time: 11/08/16  1918     History   Chief Complaint Chief Complaint  Patient presents with  . Nausea    HPI Heather Joyce is a 26 y.o. female.  26 year old female presents with nausea times several days. Patient's last menstrual period was early December. She denies any abdominal pain. No vaginal bleeding or discharge. Nausea is worse in the morning. No associated weakness. Her emesis has been nonbilious or bloody. Denies any palpitations or syncope. No prior history of pregnancy.      Past Medical History:  Diagnosis Date  . 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   Past Surgical History:  Procedure Laterality Date  . DENTAL SURGERY      OB History    Gravida Para Term Preterm AB Living   1             SAB TAB Ectopic Multiple Live Births                   Home Medications    Prior to Admission medications   Medication Sig Start Date End Date Taking? Authorizing Provider  acetaminophen (TYLENOL) 500 MG tablet Take 1,000 mg by mouth every 6 (six) hours as needed for mild pain or headache.    Yes Historical Provider, MD  ondansetron (ZOFRAN ODT) 4 MG disintegrating tablet Take 1 tablet (4 mg total) by mouth every 8 (eight) hours as needed for nausea or vomiting. Patient not taking: Reported on 11/08/2016 08/06/16   Antony Madura, PA-C  promethazine (PHENERGAN) 25 MG tablet Take 1 tablet (25 mg total) by mouth every 6 (six) hours as needed for nausea or vomiting. Patient not taking: Reported on 11/08/2016 06/29/16   Antony Madura, PA-C    Family History History reviewed. No pertinent family history.  Social History Social History  Substance Use Topics  . Smoking status: Never Smoker  . Smokeless tobacco: Never Used  . Alcohol use No     Allergies   Peanut-containing drug products; Penicillins; and Pineapple   Review of Systems Review of Systems    All other systems reviewed and are negative.    Physical Exam Updated Vital Signs BP 109/77 (BP Location: Left Arm)   Pulse 95   Temp 98.3 F (36.8 C) (Oral)   Resp 18   Ht 5' 4.5" (1.638 m)   LMP 09/29/2016 Comment: possible pregnant  SpO2 100%   Physical Exam  Constitutional: She is oriented to person, place, and time. She appears well-developed and well-nourished.  Non-toxic appearance. No distress.  HENT:  Head: Normocephalic and atraumatic.  Eyes: Conjunctivae, EOM and lids are normal. Pupils are equal, round, and reactive to light.  Neck: Normal range of motion. Neck supple. No tracheal deviation present. No thyroid mass present.  Cardiovascular: Normal rate, regular rhythm and normal heart sounds.  Exam reveals no gallop.   No murmur heard. Pulmonary/Chest: Effort normal and breath sounds normal. No stridor. No respiratory distress. She has no decreased breath sounds. She has no wheezes. She has no rhonchi. She has no rales.  Abdominal: Soft. Normal appearance and bowel sounds are normal. She exhibits no distension. There is no tenderness. There is no rebound and no CVA tenderness.  Musculoskeletal: Normal range of motion. She exhibits no edema or tenderness.  Neurological: She is alert and oriented to person, place, and time. She has normal strength. No  cranial nerve deficit or sensory deficit. GCS eye subscore is 4. GCS verbal subscore is 5. GCS motor subscore is 6.  Skin: Skin is warm and dry. No abrasion and no rash noted.  Psychiatric: She has a normal mood and affect. Her speech is normal and behavior is normal.  Nursing note and vitals reviewed.    ED Treatments / Results  Labs (all labs ordered are listed, but only abnormal results are displayed) Labs Reviewed  PREGNANCY, URINE - Abnormal; Notable for the following:       Result Value   Preg Test, Ur POSITIVE (*)    All other components within normal limits  URINALYSIS, ROUTINE W REFLEX MICROSCOPIC  I-STAT  CHEM 8, ED    EKG  EKG Interpretation None       Radiology No results found.  Procedures Procedures (including critical care time)  Medications Ordered in ED Medications  lactated ringers bolus 2,000 mL (not administered)  ondansetron (ZOFRAN) injection 4 mg (not administered)     Initial Impression / Assessment and Plan / ED Course  I have reviewed the triage vital signs and the nursing notes.  Pertinent labs & imaging results that were available during my care of the patient were reviewed by me and considered in my medical decision making (see chart for details).  Clinical Course     Patient given 2 L of lactated Ringer's here. Patient has no abdominal pain. She has no vaginal bleeding. Do not think that she has ectopic at this time. Her last menstrual period was possibly 6-7 weeks ago. Has evidence of ketones in the urine. We'll discharge with Zofran and [facility]  Final Clinical Impressions(s) / ED Diagnoses   Final diagnoses:  None    New Prescriptions New Prescriptions   No medications on file     Lorre NickAnthony Dontarius Sheley, MD 11/08/16 2328

## 2016-11-08 NOTE — ED Triage Notes (Signed)
Pt admits to multiple emesis over last several days is late on menuses .

## 2016-11-10 ENCOUNTER — Encounter (HOSPITAL_COMMUNITY): Payer: Self-pay | Admitting: *Deleted

## 2016-11-10 ENCOUNTER — Emergency Department (HOSPITAL_COMMUNITY)
Admission: EM | Admit: 2016-11-10 | Discharge: 2016-11-10 | Disposition: A | Discharge status: Home / Self Care | Payer: Medicaid Other | Attending: Emergency Medicine | Admitting: Emergency Medicine

## 2016-11-10 DIAGNOSIS — O219 Vomiting of pregnancy, unspecified: Secondary | ICD-10-CM | POA: Insufficient documentation

## 2016-11-10 DIAGNOSIS — Z91010 Allergy to peanuts: Secondary | ICD-10-CM | POA: Insufficient documentation

## 2016-11-10 DIAGNOSIS — Z79899 Other long term (current) drug therapy: Secondary | ICD-10-CM | POA: Insufficient documentation

## 2016-11-10 DIAGNOSIS — Z3A08 8 weeks gestation of pregnancy: Secondary | ICD-10-CM | POA: Insufficient documentation

## 2016-11-10 DIAGNOSIS — O210 Mild hyperemesis gravidarum: Secondary | ICD-10-CM | POA: Insufficient documentation

## 2016-11-10 LAB — COMPREHENSIVE METABOLIC PANEL
ALBUMIN: 4.4 g/dL (ref 3.5–5.0)
ALT: 14 U/L (ref 14–54)
ANION GAP: 14 (ref 5–15)
AST: 19 U/L (ref 15–41)
Alkaline Phosphatase: 41 U/L (ref 38–126)
BILIRUBIN TOTAL: 1.2 mg/dL (ref 0.3–1.2)
BUN: <5 mg/dL — ABNORMAL LOW (ref 6–20)
CO2: 18 mmol/L — AB (ref 22–32)
Calcium: 9.6 mg/dL (ref 8.9–10.3)
Chloride: 104 mmol/L (ref 101–111)
Creatinine, Ser: 0.66 mg/dL (ref 0.44–1.00)
GFR calc Af Amer: >60 mL/min (ref >60)
GFR calc non Af Amer: >60 mL/min (ref >60)
GLUCOSE: 98 mg/dL (ref 65–99)
POTASSIUM: 3.1 mmol/L — AB (ref 3.5–5.1)
SODIUM: 136 mmol/L (ref 135–145)
TOTAL PROTEIN: 7.5 g/dL (ref 6.5–8.1)

## 2016-11-10 LAB — LIPASE, BLOOD: Lipase: 33 U/L (ref 11–51)

## 2016-11-10 LAB — CBC
HEMATOCRIT: 36.6 % (ref 36.0–46.0)
HEMOGLOBIN: 13.1 g/dL (ref 12.0–15.0)
MCH: 30.3 pg (ref 26.0–34.0)
MCHC: 35.8 g/dL (ref 30.0–36.0)
MCV: 84.5 fL (ref 78.0–100.0)
Platelets: 351 10*3/uL (ref 150–400)
RBC: 4.33 MIL/uL (ref 3.87–5.11)
RDW: 12.2 % (ref 11.5–15.5)
WBC: 12.7 10*3/uL — ABNORMAL HIGH (ref 4.0–10.5)

## 2016-11-10 LAB — I-STAT BETA HCG BLOOD, ED (MC, WL, AP ONLY): I-stat hCG, quantitative: >2000 m[IU]/mL — ABNORMAL HIGH (ref <5)

## 2016-11-10 LAB — MAGNESIUM: MAGNESIUM: 1.7 mg/dL (ref 1.7–2.4)

## 2016-11-10 MED ORDER — ONDANSETRON 4 MG PO TBDP
4.0000 mg | ORAL_TABLET | Freq: Once | ORAL | Status: AC
  Administered 2016-11-10: 4 mg via ORAL

## 2016-11-10 MED ORDER — COMPLETENATE 29-1 MG PO CHEW
1.0000 | CHEWABLE_TABLET | Freq: Every day | ORAL | Status: DC
  Administered 2016-11-10: 1 via ORAL
  Filled 2016-11-10: qty 1

## 2016-11-10 MED ORDER — ONDANSETRON 4 MG PO TBDP
ORAL_TABLET | ORAL | Status: AC
  Filled 2016-11-10: qty 1

## 2016-11-10 MED ORDER — LACTATED RINGERS IV BOLUS (SEPSIS)
1000.0000 mL | Freq: Once | INTRAVENOUS | Status: AC
  Administered 2016-11-10: 1000 mL via INTRAVENOUS

## 2016-11-10 MED ORDER — THIAMINE HCL 100 MG/ML IJ SOLN
100.0000 mg | Freq: Once | INTRAMUSCULAR | Status: AC
  Administered 2016-11-10: 100 mg via INTRAVENOUS
  Filled 2016-11-10: qty 2

## 2016-11-10 MED ORDER — PROMETHAZINE HCL 12.5 MG PO TABS: 12.5000 mg | ORAL_TABLET | Freq: Four times a day (QID) | ORAL | 0 refills | Status: DC | PRN

## 2016-11-10 MED ORDER — PROMETHAZINE HCL 25 MG/ML IJ SOLN
25.0000 mg | Freq: Once | INTRAMUSCULAR | Status: AC
  Administered 2016-11-10: 25 mg via INTRAVENOUS
  Filled 2016-11-10: qty 1

## 2016-11-10 NOTE — ED Provider Notes (Signed)
[facility]Y DEPT Provider Note   CSN: [id number] Arrival date & time: 11/10/16  [id number]     History   [id number] Complaint Chief Complaint  Patient presents with  . Emesis    HPI Heather Joyce is a 26 y.o. female.  HPI   G1P1 LMP Dec 1 with no prenatal care presents to ED with uncontrolled N/V.  Was seen in ED for same 11/08/16, hydrated and d/c with zofran.  Pt was unable to afford the zofran and returns today, unable to tolerate PO.  States she has been trying to drink gatorade and eat crackers but is unable to keep anything down at all.  Denies fevers, abdominal pain, dysuria, abnormal vaginal discharge or bleeding, diarrhea, constipation, or bloody stool.  Has occasional mild abdominal cramping, last was yesterday.  Has [facility]    Past Medical History:  Diagnosis Date  . 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   Past Surgical History:  Procedure Laterality Date  . DENTAL SURGERY      OB History    Gravida Para Term Preterm AB Living   1             SAB TAB Ectopic Multiple Live Births                   Home Medications    Prior to Admission medications   Medication Sig Start Date End Date Taking? Authorizing Provider  calcium carbonate (TUMS - DOSED IN MG ELEMENTAL CALCIUM) 500 MG chewable tablet Chew 1 tablet by mouth daily as needed for indigestion or heartburn.   Yes Historical Provider, MD  promethazine (PHENERGAN) 12.5 MG tablet Take 1 tablet (12.5 mg total) by mouth every 6 (six) hours as needed for nausea, vomiting or refractory nausea / vomiting. 11/10/16   Trixie DredgeEmily Xayden Linsey, PA-C    Family History No family history on file.  Social History Social History  Substance Use Topics  . Smoking status: Never Smoker  . Smokeless tobacco: Never Used  . Alcohol use No     Allergies   Peanut-containing drug products; Penicillins; and  Pineapple   Review of Systems Review of Systems  All other systems reviewed and are negative.    Physical Exam Updated Vital Signs BP (!) 104/52 (BP Location: Right Arm)   Pulse 81   Temp 98.3 F (36.8 C) (Oral)   Resp 15   LMP 09/29/2016 Comment: possible pregnant  SpO2 100%   Physical Exam  Constitutional: She appears well-developed and well-nourished. No distress.  HENT:  Head: Normocephalic and atraumatic.  Neck: Neck supple.  Cardiovascular: Normal rate and regular rhythm.   Pulmonary/Chest: Effort normal and breath sounds normal. No respiratory distress. She has no wheezes. She has no rales.  Abdominal: Soft. She exhibits no distension. There is no tenderness. There is no rebound and no guarding.  Neurological: She is alert.  Skin: She is not diaphoretic.  Nursing note and vitals reviewed.    ED Treatments / Results  Labs (all labs ordered are listed, but only abnormal results are displayed) Labs Reviewed  COMPREHENSIVE METABOLIC PANEL - Abnormal; Notable for the following:       Result Value   Potassium 3.1 (*)    CO2 18 (*)    BUN <5 (*)    All other components within normal limits  CBC - Abnormal; Notable for  the following:    WBC 12.7 (*)    All other components within normal limits  I-STAT BETA HCG BLOOD, ED ([facility], [facility], [facility] ONLY) - Abnormal; Notable for the following:    I-stat hCG, quantitative >2,000.0 (*)    All other components within normal limits  LIPASE, BLOOD  MAGNESIUM    EKG  EKG Interpretation None       Radiology No results found.  Procedures Procedures (including critical care time)  Medications Ordered in ED Medications  ondansetron (ZOFRAN-ODT) disintegrating tablet 4 mg (4 mg Oral Given 11/10/16 0836)  promethazine (PHENERGAN) injection 25 mg (25 mg Intravenous Given 11/10/16 1059)  thiamine (B-1) injection 100 mg (100 mg Intravenous Given 11/10/16 1058)  lactated ringers bolus 1,000 mL (0 mLs Intravenous Stopped 11/10/16 1305)      Initial Impression / Assessment and Plan / ED Course  I have reviewed the triage vital signs and the nursing notes.  Pertinent labs & imaging results that were available during my care of the patient were reviewed by me and considered in my medical decision making (see chart for details).  Clinical Course as of Nov 11 1923  Caleen Essex Nov 10, 2016  1233 Pt feeling much better.  Tolerating oral fluids.  Is planning to buy prenatal gummy vitamins today.  Discussed return precautions and follow up.    [EW]    Clinical Course User Index [EW] Trixie Dredge, PA-C   I have discussed with patient the safety of medications in pregnancy, the fact that our knowledge of drug safety in pregnancy is limited and that I can only make recommendations based on what is currently known at this time and the safety ratings given to medications.  We discussed that medications may pass through the placenta and have encouraged them to make their own decisions regarding the medications used in light of this information.    Afebrile nontoxic pregnant patient with uncontrolled N/V.  Rehydrated with NS, LR.  Given promethazine.  Labs  Reassuring.  Pt feeling much better. Pt without abdominal pain, vaginal bleeding - no emergent need for imaging.  Doubt reputured ectopic.  D/C home with OBGYN follow up, return precautions.  Discussed result, findings, treatment, and follow up  with patient.  Pt given return precautions.  Pt verbalizes understanding and agrees with plan.      Final Clinical Impressions(s) / ED Diagnoses   Final diagnoses:  Hyperemesis gravidarum    New Prescriptions Discharge Medication List as of 11/10/2016 12:36 PM       Trixie Dredge, PA-C 11/10/16 1925    Arby Barrette, MD 11/14/16 [phone]

## 2016-11-10 NOTE — Discharge Instructions (Signed)
Read the information below.  Use the prescribed medication as directed.  Please discuss all new medications with your pharmacist.  You may return to the Emergency Department at any time for worsening condition or any new symptoms that concern you.    If you develop high fevers, abdominal pain, uncontrolled vomiting, or are unable to tolerate fluids by mouth, return to the ER for a recheck.   For any pregnancy-specific concerns, go directly to the [facility] for a recheck.

## 2016-11-10 NOTE — ED Triage Notes (Signed)
Pt in c/o constant vomiting onset x 3 days, pt seen at [facility] x 2 days ago told she was pregnant & prescribed zofran, pt reports not being able to afford medicine, pt here today reporting 10-15 vomiting episodes over the last 24 hrs, denies diarrhea, pt A&O x4

## 2016-12-11 LAB — OB RESULTS CONSOLE RPR: RPR: NONREACTIVE

## 2016-12-11 LAB — OB RESULTS CONSOLE HEPATITIS B SURFACE ANTIGEN: Hepatitis B Surface Ag: NEGATIVE

## 2016-12-11 LAB — OB RESULTS CONSOLE GC/CHLAMYDIA
Chlamydia: NEGATIVE
Gonorrhea: NEGATIVE

## 2016-12-11 LAB — OB RESULTS CONSOLE RUBELLA ANTIBODY, IGM: RUBELLA: IMMUNE

## 2016-12-11 LAB — OB RESULTS CONSOLE HIV ANTIBODY (ROUTINE TESTING): HIV: NONREACTIVE

## 2017-01-02 ENCOUNTER — Observation Stay (HOSPITAL_BASED_OUTPATIENT_CLINIC_OR_DEPARTMENT_OTHER): Payer: Medicaid Other

## 2017-01-02 ENCOUNTER — Inpatient Hospital Stay (HOSPITAL_COMMUNITY): Payer: Medicaid Other

## 2017-01-02 ENCOUNTER — Inpatient Hospital Stay (HOSPITAL_COMMUNITY)
Admission: AD | Admit: 2017-01-02 | Discharge: 2017-01-04 | DRG: 781 | Disposition: A | Discharge status: Home / Self Care | Payer: Medicaid Other | Source: Ambulatory Visit | Attending: Internal Medicine | Admitting: Internal Medicine

## 2017-01-02 ENCOUNTER — Encounter (HOSPITAL_COMMUNITY): Payer: Self-pay | Admitting: *Deleted

## 2017-01-02 DIAGNOSIS — E871 Hypo-osmolality and hyponatremia: Secondary | ICD-10-CM | POA: Diagnosis not present

## 2017-01-02 DIAGNOSIS — R079 Chest pain, unspecified: Secondary | ICD-10-CM | POA: Diagnosis present

## 2017-01-02 DIAGNOSIS — E876 Hypokalemia: Secondary | ICD-10-CM | POA: Diagnosis not present

## 2017-01-02 DIAGNOSIS — O99281 Endocrine, nutritional and metabolic diseases complicating pregnancy, first trimester: Secondary | ICD-10-CM | POA: Diagnosis present

## 2017-01-02 DIAGNOSIS — R000 Tachycardia, unspecified: Secondary | ICD-10-CM | POA: Diagnosis present

## 2017-01-02 DIAGNOSIS — O99111 Other diseases of the blood and blood-forming organs and certain disorders involving the immune mechanism complicating pregnancy, first trimester: Secondary | ICD-10-CM | POA: Diagnosis present

## 2017-01-02 DIAGNOSIS — R072 Precordial pain: Secondary | ICD-10-CM

## 2017-01-02 DIAGNOSIS — R0789 Other chest pain: Secondary | ICD-10-CM

## 2017-01-02 DIAGNOSIS — O99411 Diseases of the circulatory system complicating pregnancy, first trimester: Secondary | ICD-10-CM | POA: Diagnosis present

## 2017-01-02 DIAGNOSIS — Z91010 Allergy to peanuts: Secondary | ICD-10-CM

## 2017-01-02 DIAGNOSIS — D72829 Elevated white blood cell count, unspecified: Secondary | ICD-10-CM | POA: Diagnosis not present

## 2017-01-02 DIAGNOSIS — E0590 Thyrotoxicosis, unspecified without thyrotoxic crisis or storm: Secondary | ICD-10-CM | POA: Diagnosis present

## 2017-01-02 DIAGNOSIS — E860 Dehydration: Secondary | ICD-10-CM | POA: Diagnosis present

## 2017-01-02 DIAGNOSIS — Z3A13 13 weeks gestation of pregnancy: Secondary | ICD-10-CM

## 2017-01-02 DIAGNOSIS — O219 Vomiting of pregnancy, unspecified: Secondary | ICD-10-CM | POA: Diagnosis present

## 2017-01-02 DIAGNOSIS — O99611 Diseases of the digestive system complicating pregnancy, first trimester: Secondary | ICD-10-CM | POA: Diagnosis present

## 2017-01-02 DIAGNOSIS — K219 Gastro-esophageal reflux disease without esophagitis: Secondary | ICD-10-CM

## 2017-01-02 DIAGNOSIS — O26891 Other specified pregnancy related conditions, first trimester: Principal | ICD-10-CM | POA: Diagnosis present

## 2017-01-02 DIAGNOSIS — R0602 Shortness of breath: Secondary | ICD-10-CM | POA: Diagnosis not present

## 2017-01-02 DIAGNOSIS — I493 Ventricular premature depolarization: Secondary | ICD-10-CM | POA: Diagnosis present

## 2017-01-02 DIAGNOSIS — Z3490 Encounter for supervision of normal pregnancy, unspecified, unspecified trimester: Secondary | ICD-10-CM

## 2017-01-02 DIAGNOSIS — Z91018 Allergy to other foods: Secondary | ICD-10-CM

## 2017-01-02 DIAGNOSIS — Z880 Allergy status to penicillin: Secondary | ICD-10-CM

## 2017-01-02 LAB — URINALYSIS, ROUTINE W REFLEX MICROSCOPIC
BACTERIA UA: NONE SEEN
Glucose, UA: NEGATIVE mg/dL
Hgb urine dipstick: NEGATIVE
KETONES UR: 80 mg/dL — AB
LEUKOCYTES UA: NEGATIVE
Nitrite: NEGATIVE
Protein, ur: 100 mg/dL — AB
Specific Gravity, Urine: 1.031 — ABNORMAL HIGH (ref 1.005–1.030)
pH: 6 (ref 5.0–8.0)

## 2017-01-02 LAB — COMPREHENSIVE METABOLIC PANEL
ALK PHOS: 42 U/L (ref 38–126)
ALT: 24 U/L (ref 14–54)
AST: 35 U/L (ref 15–41)
Albumin: 4.4 g/dL (ref 3.5–5.0)
Anion gap: 15 (ref 5–15)
BILIRUBIN TOTAL: 0.6 mg/dL (ref 0.3–1.2)
BUN: 8 mg/dL (ref 6–20)
CALCIUM: 10.1 mg/dL (ref 8.9–10.3)
CO2: 24 mmol/L (ref 22–32)
CREATININE: 0.62 mg/dL (ref 0.44–1.00)
Chloride: 91 mmol/L — ABNORMAL LOW (ref 101–111)
GFR calc Af Amer: >60 mL/min (ref >60)
GFR calc non Af Amer: >60 mL/min (ref >60)
GLUCOSE: 123 mg/dL — AB (ref 65–99)
Potassium: 2.8 mmol/L — ABNORMAL LOW (ref 3.5–5.1)
Sodium: 130 mmol/L — ABNORMAL LOW (ref 135–145)
TOTAL PROTEIN: 8.3 g/dL — AB (ref 6.5–8.1)

## 2017-01-02 LAB — TROPONIN I: Troponin I: 0.03 ng/mL (ref <0.03)

## 2017-01-02 LAB — ECHOCARDIOGRAM COMPLETE
Height: 64 in
Weight: 1776 oz

## 2017-01-02 LAB — I-STAT TROPONIN, ED: Troponin i, poc: 0 ng/mL (ref 0.00–0.08)

## 2017-01-02 LAB — D-DIMER, QUANTITATIVE: D-Dimer, Quant: 0.32 ug/mL-FEU (ref 0.00–0.50)

## 2017-01-02 LAB — MAGNESIUM: MAGNESIUM: 1.6 mg/dL — AB (ref 1.7–2.4)

## 2017-01-02 MED ORDER — ACETAMINOPHEN 325 MG PO TABS: 650.0000 mg | ORAL_TABLET | Freq: Four times a day (QID) | ORAL | Status: DC | PRN

## 2017-01-02 MED ORDER — POTASSIUM CHLORIDE CRYS ER 20 MEQ PO TBCR
40.0000 meq | EXTENDED_RELEASE_TABLET | Freq: Once | ORAL | Status: AC
  Administered 2017-01-02: 40 meq via ORAL
  Filled 2017-01-02: qty 2

## 2017-01-02 MED ORDER — LACTATED RINGERS IV SOLN: INTRAVENOUS | Status: DC

## 2017-01-02 MED ORDER — ASPIRIN 81 MG PO CHEW
CHEWABLE_TABLET | ORAL | Status: AC
  Filled 2017-01-02: qty 4

## 2017-01-02 MED ORDER — SODIUM CHLORIDE 0.9% FLUSH: 3.0000 mL | Freq: Two times a day (BID) | INTRAVENOUS | Status: DC

## 2017-01-02 MED ORDER — LACTATED RINGERS IV BOLUS (SEPSIS): 1000.0000 mL | Freq: Once | INTRAVENOUS | Status: DC

## 2017-01-02 MED ORDER — ASPIRIN 81 MG PO CHEW
324.0000 mg | CHEWABLE_TABLET | Freq: Once | ORAL | Status: AC
  Administered 2017-01-02: 324 mg via ORAL

## 2017-01-02 MED ORDER — PRENATAL MULTIVITAMIN CH
1.0000 | ORAL_TABLET | Freq: Every day | ORAL | Status: DC
  Administered 2017-01-03 – 2017-01-04 (×2): 1 via ORAL
  Filled 2017-01-02 (×2): qty 1

## 2017-01-02 MED ORDER — ACETAMINOPHEN 650 MG RE SUPP: 650.0000 mg | Freq: Four times a day (QID) | RECTAL | Status: DC | PRN

## 2017-01-02 MED ORDER — NITROGLYCERIN 0.4 MG SL SUBL
SUBLINGUAL_TABLET | SUBLINGUAL | Status: AC
  Filled 2017-01-02: qty 1

## 2017-01-02 MED ORDER — SODIUM CHLORIDE 0.9 % IV BOLUS (SEPSIS)
500.0000 mL | Freq: Once | INTRAVENOUS | Status: AC
  Administered 2017-01-02: 500 mL via INTRAVENOUS

## 2017-01-02 MED ORDER — PROMETHAZINE HCL 25 MG PO TABS: 12.5000 mg | ORAL_TABLET | Freq: Four times a day (QID) | ORAL | Status: DC | PRN

## 2017-01-02 MED ORDER — KCL IN DEXTROSE-NACL 20-5-0.9 MEQ/L-%-% IV SOLN
INTRAVENOUS | Status: DC
  Administered 2017-01-02 – 2017-01-03 (×2): via INTRAVENOUS
  Filled 2017-01-02 (×3): qty 1000

## 2017-01-02 MED ORDER — FAMOTIDINE 20 MG PO TABS
20.0000 mg | ORAL_TABLET | Freq: Every day | ORAL | Status: DC
  Administered 2017-01-02 – 2017-01-04 (×3): 20 mg via ORAL
  Filled 2017-01-02 (×3): qty 1

## 2017-01-02 MED ORDER — POTASSIUM CHLORIDE 10 MEQ/100ML IV SOLN
10.0000 meq | INTRAVENOUS | Status: DC
Start: 2017-01-02 — End: 2017-01-02
  Filled 2017-01-02 (×3): qty 100

## 2017-01-02 MED ORDER — NITROGLYCERIN 0.4 MG SL SUBL
0.4000 mg | SUBLINGUAL_TABLET | Freq: Once | SUBLINGUAL | Status: AC
  Administered 2017-01-02: 0.4 mg via SUBLINGUAL

## 2017-01-02 MED ORDER — SODIUM CHLORIDE 0.9 % IV SOLN
999.0000 mL | INTRAVENOUS | Status: DC
  Administered 2017-01-02: 999 mL via INTRAVENOUS

## 2017-01-02 MED ORDER — SODIUM CHLORIDE 0.9 % IV SOLN: 30.0000 meq | Freq: Once | INTRAVENOUS | Status: DC

## 2017-01-02 NOTE — MAU Note (Signed)
Provider aware urine not collected during MAU visit.

## 2017-01-02 NOTE — ED Notes (Addendum)
Pt given ice water.

## 2017-01-02 NOTE — MAU Provider Note (Signed)
History     CSN: [id number]  Arrival date and time: 01/02/17 3267   First Provider Initiated Contact with Patient 01/02/17 [phone]      No chief complaint on file.  G2P0010 '@13'  wks here with chest pain. She reports midsternal pain that started around 3-4 am. She describes as pressure and heaviness. Pain does not radiate to jaw or arm. Associated sx are SOB that started shortly after CP. She denies fevers. Denies drug use.   This is a new symptom.  She has never had  She reports the chest pain is intermittent, worse when she is standing up, improves slightly with rest.  She has not tried any treatments.  She denies recent illness or cough.  She does report n/v of pregnancy with significant vomiting >9-10 times yesterday.  OB History    Gravida Para Term Preterm AB Living   1             SAB TAB Ectopic Multiple Live Births                  Past Medical History:  Diagnosis Date  . Medical history non-contributory   . 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 DENTAL SURGERY    . NO PAST SURGERIES      No family history on file.  Social History  Substance Use Topics  . Smoking status: Never Smoker  . Smokeless tobacco: Never Used  . Alcohol use No    Allergies:  Allergies  Allergen Reactions  . Peanut-Containing Drug Products Itching  . Penicillins Other (See Comments)    Unknown, Has patient had a PCN reaction causing immediate rash, facial/tongue/throat swelling, SOB or lightheadedness with hypotension: No, whole family is allergic, put on as a precaution Has patient had a PCN reaction causing severe rash involving mucus membranes or skin necrosis: No Has patient had a PCN reaction that required hospitalization No Has patient had a PCN reaction occurring within the last 10 years: No If all of the above answers are "NO", then may proceed with Cephalosporin use.   Marland Kitchen Pineapple Swelling    Prescriptions Prior to Admission  Medication  Sig Dispense Refill Last Dose  . calcium carbonate (TUMS - DOSED IN MG ELEMENTAL CALCIUM) 500 MG chewable tablet Chew 1 tablet by mouth daily as needed for indigestion or heartburn.   01/01/2017 at Unknown time  . promethazine (PHENERGAN) 12.5 MG tablet Take 1 tablet (12.5 mg total) by mouth every 6 (six) hours as needed for nausea, vomiting or refractory nausea / vomiting. (Patient not taking: Reported on 01/02/2017) 20 tablet 0 Not Taking at Unknown time    Review of Systems  Constitutional: Negative for fever.  HENT: Negative for congestion.   Respiratory: Positive for shortness of breath. Negative for chest tightness and wheezing.   Cardiovascular: Positive for chest pain.  Gastrointestinal: Negative for abdominal pain.  Genitourinary: Negative for vaginal bleeding.   Physical Exam   Blood pressure 116/84, pulse 94, temperature 98.6 F (37 C), temperature source Oral, resp. rate 18, height '5\' 4"'  (1.626 m), weight 111 lb (50.3 kg), last menstrual period 09/29/2016, SpO2 100 %, unknown if currently breastfeeding.  Physical Exam  Nursing note and vitals reviewed. Constitutional: She is oriented to person, place, and time. She appears well-developed and well-nourished.  HENT:  Head: Normocephalic and atraumatic.  Neck: Normal range of motion.  Cardiovascular: Normal rate, regular rhythm and normal heart sounds.   Respiratory:  Effort normal and breath sounds normal. No respiratory distress.  Musculoskeletal: Normal range of motion.  Neurological: She is alert and oriented to person, place, and time.  Skin: Skin is warm and dry.  Psychiatric: She has a normal mood and affect.   Note:  Pt HR increases to 140s when sitting up, slows to 100 when at rest.  Initial EKG: sinus arrythmia with occasional PVCs. Repeat EKG with the following: sinus tachycardia with T wave abnormality  Results for orders placed or performed during the hospital encounter of 01/02/17 (from the past 48 hour(s))  CBC      Status: Abnormal   Collection Time: 01/02/17  9:59 AM  Result Value Ref Range   WBC 16.4 (H) 4.0 - 10.5 K/uL   RBC 4.80 3.87 - 5.11 MIL/uL   Hemoglobin 14.4 12.0 - 15.0 g/dL   HCT 38.5 36.0 - 46.0 %   MCV 80.2 78.0 - 100.0 fL   MCH 30.0 26.0 - 34.0 pg   MCHC 37.4 (H) 30.0 - 36.0 g/dL    Comment: REPEATED TO VERIFY   RDW 12.6 11.5 - 15.5 %   Platelets 366 150 - 400 K/uL  Comprehensive metabolic panel     Status: Abnormal   Collection Time: 01/02/17  9:59 AM  Result Value Ref Range   Sodium 130 (L) 135 - 145 mmol/L   Potassium 2.8 (L) 3.5 - 5.1 mmol/L   Chloride 91 (L) 101 - 111 mmol/L   CO2 24 22 - 32 mmol/L   Glucose, Bld 123 (H) 65 - 99 mg/dL   BUN 8 6 - 20 mg/dL   Creatinine, Ser 0.62 0.44 - 1.00 mg/dL   Calcium 10.1 8.9 - 10.3 mg/dL   Total Protein 8.3 (H) 6.5 - 8.1 g/dL   Albumin 4.4 3.5 - 5.0 g/dL   AST 35 15 - 41 U/L   ALT 24 14 - 54 U/L   Alkaline Phosphatase 42 38 - 126 U/L   Total Bilirubin 0.6 0.3 - 1.2 mg/dL   GFR calc non Af Amer >60 >60 mL/min   GFR calc Af Amer >60 >60 mL/min    Comment: (NOTE) The eGFR has been calculated using the CKD EPI equation. This calculation has not been validated in all clinical situations. eGFR's persistently <60 mL/min signify possible Chronic Kidney Disease.    Anion gap 15 5 - 15  Troponin I (q 6hr x 3)     Status: Abnormal   Collection Time: 01/02/17  9:59 AM  Result Value Ref Range   Troponin I 0.03 (HH) <0.03 ng/mL    Comment: CRITICAL RESULT CALLED TO, READ BACK BY AND VERIFIED WITH: L.WESTON,RN 1132 01/02/17 TYSOR,T Performed at [facility], [place] [address] [place], [place] [address]   D-dimer, quantitative (not at [facility])     Status: None   Collection Time: 01/02/17  9:59 AM  Result Value Ref Range   D-Dimer, Quant 0.32 0.00 - 0.50 ug/mL-FEU    Comment: (NOTE) At the manufacturer cut-off of 0.50 ug/mL FEU, this assay has been documented to exclude PE with a sensitivity and negative predictive value of 97  to 99%.  At this time, this assay has not been approved by the FDA to exclude DVT/VTE. Results should be correlated with clinical presentation.    MAU Course  Procedures ASA Nitroglycerine O2 IV fluid bolus x 1000 ml  Potassium 10 meq   MDM Labs, EKG, and CXR ordered.  Care transferred to [facility], [place]. Fatima Blank,  CNM 01/02/17 1005  Pt unable to give urine sample during MAU stay even after IV fluid bolus given. Likely dehydration from vomiting contributing to symptoms but with EKG changes/variable EKG in MAU and troponin 0.03 with continued CP, pt transferred to [facility] for further cardiology evaluation. Pt is stable from obstetric standpoint with normal FHT.  Dr Oleta Mouse is accepting physician. EMTALA form completed.   Assessment and Plan    1. Chest pain   2. Shortness of breath   3. [redacted] weeks gestation of pregnancy   4. Hypokalemia    P: Transfer to [facility] for further cardiology evaluation IV fluids and 10 meq of K running [facility] notified Pt stable at time of transfer  Fatima Blank, CNM 12:56 PM

## 2017-01-02 NOTE — MAU Note (Signed)
Critical triponin level reported to [place],CNM

## 2017-01-02 NOTE — ED Notes (Signed)
Pt states she is no longer SOB and does not need O2 [place] anymore

## 2017-01-02 NOTE — H&P (Signed)
History and Physical    Heather Joyce ZOX:[id number] DOB: [birth year] DOA: 01/02/2017  Referring MD/NP/PA: er PCP: No PCP Per Patient Outpatient Specialists:  [facility]  Chief Complaint: chest pain  HPI: Heather Joyce is a 26 y.o. female with medical history significant of approximately 13 week pregnancy and no other medical issues.  She [facility] with chest pain that woke her up this AM.  Pain worsens and HR elevates when sitting up and improves when laying down.  Pain is described as heavy, non-radiating, pressure-like pain.  Patient is tender on palpation but not the same pain as in her chest.  No current nausea or  Vomiting-- stopped at about 12 weeks.  Pain not similar to patient's GERD in the past.  Pain did worsen after taking some pills in the ER with water.  Pain was not relived with ASA or nitro.  SOB when sitting up as well.  No fevers  IN the ER: d dimer negative, labs remarkable for hyponatremia, and hypokalemia.  EKG had some non-specific T wave inversions.  Troponin 0.03   Review of Systems: all systems reviewed, negative unless stated above in HPI   Past Medical History:  Diagnosis Date  . Medical history non-contributory   . 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 DENTAL SURGERY    . NO PAST SURGERIES       reports that she has never smoked. She has never used smokeless tobacco. She reports that she does not drink alcohol or use drugs.  Allergies  Allergen Reactions  . Peanut-Containing Drug Products Itching  . Penicillins Other (See Comments)    Unknown, Has patient had a PCN reaction causing immediate rash, facial/tongue/throat swelling, SOB or lightheadedness with hypotension: No, whole family is allergic, put on as a precaution Has patient had a PCN reaction causing severe rash involving mucus membranes or skin necrosis: No Has patient had a PCN reaction that required  hospitalization No Has patient had a PCN reaction occurring within the last 10 years: No If all of the above answers are "NO", then may proceed with Cephalosporin use.   Marland Kitchen Pineapple Swelling     Family hx: dad had "enlarged heart"  Prior to Admission medications   Medication Sig Start Date End Date Taking? Authorizing Provider  calcium carbonate (TUMS - DOSED IN MG ELEMENTAL CALCIUM) 500 MG chewable tablet Chew 1 tablet by mouth daily as needed for indigestion or heartburn.   Yes Historical Provider, MD  promethazine (PHENERGAN) 12.5 MG tablet Take 1 tablet (12.5 mg total) by mouth every 6 (six) hours as needed for nausea, vomiting or refractory nausea / vomiting. Patient not taking: Reported on 01/02/2017 11/10/16   Trixie Dredge, PA-C    Physical Exam: Vitals:   01/02/17 1327 01/02/17 1345 01/02/17 1430 01/02/17 1445  BP: 113/74 125/78 120/76 123/75  Pulse: 99 97 104 96  Resp: 16 11 21 18   Temp: 98.9 F (37.2 C)     TempSrc: Oral     SpO2: 100% 100% 100% 100%  Weight:      Height:          Constitutional: appears uncomfortable Vitals:   01/02/17 1327 01/02/17 1345 01/02/17 1430 01/02/17 1445  BP: 113/74 125/78 120/76 123/75  Pulse: 99 97 104 96  Resp: 16 11 21 18   Temp: 98.9 F (37.2 C)     TempSrc: Oral  SpO2: 100% 100% 100% 100%  Weight:      Height:       Eyes: PERRL, lids and conjunctivae normal ENMT: Mucous membranes are moist. Posterior pharynx clear of any exudate or lesions.Normal dentition.  Neck: normal, supple, no masses, no thyromegaly Respiratory: clear to auscultation bilaterally, no wheezing, no crackles. Normal respiratory effort. No accessory muscle use.  Cardiovascular: diminished heart sounds in a thin female. No extremity edema. 2+ pedal pulses. No carotid bruits.  Chest wall tenderness  Abdomen: no tenderness, no masses palpated. No hepatosplenomegaly. Bowel sounds positive.  Musculoskeletal: no clubbing / cyanosis. No joint deformity upper and  lower extremities. Good ROM, no contractures. Normal muscle tone.  Skin: no rashes, lesions, ulcers. No induration Neurologic: CN 2-12 grossly intact. Sensation intact, DTR normal. Strength 5/5 in all 4.  Psychiatric: Normal judgment and insight. Alert and oriented x 3. Normal mood.     Labs on Admission: I have personally reviewed following labs and imaging studies  CBC:  Recent Labs Lab 01/02/17 0959  WBC 16.4*  HGB 14.4  HCT 38.5  MCV 80.2  PLT 366   Basic Metabolic Panel:  Recent Labs Lab 01/02/17 0959  NA 130*  K 2.8*  CL 91*  CO2 24  GLUCOSE 123*  BUN 8  CREATININE 0.62  CALCIUM 10.1   GFR: Estimated Creatinine Clearance: 86.8 mL/min (by C-G formula based on SCr of 0.62 mg/dL). Liver Function Tests:  Recent Labs Lab 01/02/17 0959  AST 35  ALT 24  ALKPHOS 42  BILITOT 0.6  PROT 8.3*  ALBUMIN 4.4   No results for input(s): LIPASE, AMYLASE in the last 168 hours. No results for input(s): AMMONIA in the last 168 hours. Coagulation Profile: No results for input(s): INR, PROTIME in the last 168 hours. Cardiac Enzymes:  Recent Labs Lab 01/02/17 0959  TROPONINI 0.03*   BNP (last 3 results) No results for input(s): PROBNP in the last 8760 hours. HbA1C: No results for input(s): HGBA1C in the last 72 hours. CBG: No results for input(s): GLUCAP in the last 168 hours. Lipid Profile: No results for input(s): CHOL, HDL, LDLCALC, TRIG, CHOLHDL, LDLDIRECT in the last 72 hours. Thyroid Function Tests: No results for input(s): TSH, T4TOTAL, FREET4, T3FREE, THYROIDAB in the last 72 hours. Anemia Panel: No results for input(s): VITAMINB12, FOLATE, FERRITIN, TIBC, IRON, RETICCTPCT in the last 72 hours. Urine analysis:    Component Value Date/Time   COLORURINE YELLOW 11/08/2016 2020   APPEARANCEUR TURBID (A) 11/08/2016 2020   LABSPEC 1.035 (H) 11/08/2016 2020   PHURINE 6.0 11/08/2016 2020   GLUCOSEU NEGATIVE 11/08/2016 2020   HGBUR NEGATIVE 11/08/2016 2020     BILIRUBINUR SMALL (A) 11/08/2016 2020   KETONESUR 80 (A) 11/08/2016 2020   PROTEINUR 100 (A) 11/08/2016 2020   UROBILINOGEN 0.2 03/15/2012 1022   NITRITE NEGATIVE 11/08/2016 2020   LEUKOCYTESUR NEGATIVE 11/08/2016 2020   Sepsis Labs: Invalid input(s): PROCALCITONIN, LACTICIDVEN No results found for this or any previous visit (from the past 240 hour(s)).   Radiological Exams on Admission: Dg Chest 2 View  Result Date: 01/02/2017 CLINICAL DATA:  Shortness of breath, chest pain. Thirteen weeks pregnant. Abdomen was shielded. EXAM: CHEST  2 VIEW COMPARISON:  None. FINDINGS: Heart and mediastinal contours are within normal limits. No focal opacities or effusions. No acute bony abnormality. IMPRESSION: No active cardiopulmonary disease. Electronically Signed   By: Charlett NoseKevin  Dover M.D.   On: 01/02/2017 11:06    EKG: Independently reviewed. Non-specific t wave inversions  Assessment/Plan Active Problems:   Chest pain   Hyponatremia   Hypokalemia   Pregnancy   Leukocytosis  Chest pain- doubt ACS but worrisome for pericardial effusion, doubt pericarditis, ? Musculoskeletal as tender with palpation -echo ordered -cycle CE Tele obs -d dimer negative-- may need CTA if pain continues without source  Hyponatremia -IVF  Hypokalemia -replace -check Mg  Pregnancy -uncomplicated to 13 weeks  Mild leukocytosis -trend in AM   DVT prophylaxis: scd Code Status: *full Family Communication: at bedside Disposition Plan:  Consults called:  Admission status:    Jakyah Bradby Juanetta Gosling DO [facility] Pager [phone]  If 7PM-7AM, please contact night-coverage [email or web address] Password [email or web address]  01/02/2017, 3:50 PM

## 2017-01-02 NOTE — ED Notes (Signed)
Pt to echo at this time 

## 2017-01-02 NOTE — ED Notes (Signed)
Explained to pt need for urine sample for ordered UA. Pt verbalized understanding but reports she does not have to void at this time. Pt states she will use her call light when she is ready to use the restroom. Pt asking for water and states "if I had water I would be able to go". This nurse explained to pt NPO diet order from admitting provider. Pt verbalized understanding.

## 2017-01-02 NOTE — Progress Notes (Signed)
  Echocardiogram 2D Echocardiogram has been performed.  Heather SavoyCasey N Christen Joyce 01/02/2017, 4:52 PM

## 2017-01-02 NOTE — MAU Note (Signed)
Pt sat up to take medication, pulse went up to 140"s, chest pain returned.ekg here for rpt testing.

## 2017-01-02 NOTE — ED Provider Notes (Signed)
[facility]-EMERGENCY DEPT Provider Note   CSN: [id number] Arrival date & time: 01/02/17  0935     History   Chief Complaint Chief Complaint  Patient presents with  . Chest Pain    HPI Heather Joyce is a 26 y.o. female.  The history is provided by the patient.  Chest Pain   This is a new problem. The current episode started 6 to 12 hours ago. The problem occurs constantly. The problem has not changed since onset.The pain is associated with exertion, walking and breathing. The pain is present in the substernal region. The pain is moderate. The quality of the pain is described as exertional, heavy, pleuritic and pressure-like. The pain does not radiate. Duration of episode(s) is 11 hours. The symptoms are aggravated by deep breathing and exertion. Associated symptoms include nausea, shortness of breath and vomiting. Pertinent negatives include no abdominal pain, no back pain, no cough, no fever, no leg pain, no lower extremity edema and no palpitations. Treatments tried: ASA, nitro. The treatment provided no relief.  Pertinent negatives for past medical history include no seizures.    Past Medical History:  Diagnosis Date  . Medical history non-contributory   . 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 Chest pain 01/02/2017  . Hyponatremia 01/02/2017  . Hypokalemia 01/02/2017  . Pregnancy 01/02/2017  . Leukocytosis 01/02/2017    Past Surgical History:  Procedure Laterality Date  . DENTAL SURGERY    . NO PAST SURGERIES      OB History    Gravida Para Term Preterm AB Living   1             SAB TAB Ectopic Multiple Live Births                   Home Medications    Prior to Admission medications   Medication Sig Start Date End Date Taking? Authorizing Provider  calcium carbonate (TUMS - DOSED IN MG ELEMENTAL CALCIUM) 500 MG chewable tablet Chew 1 tablet by mouth daily as needed for indigestion or heartburn.   Yes Historical Provider, MD    promethazine (PHENERGAN) 12.5 MG tablet Take 1 tablet (12.5 mg total) by mouth every 6 (six) hours as needed for nausea, vomiting or refractory nausea / vomiting. Patient not taking: Reported on 01/02/2017 11/10/16   Trixie DredgeEmily West, PA-C    Family History History reviewed. No pertinent family history.  Social History Social History  Substance Use Topics  . Smoking status: Never Smoker  . Smokeless tobacco: Never Used  . Alcohol use No     Allergies   Peanut-containing drug products; Penicillins; and Pineapple   Review of Systems Review of Systems  Constitutional: Negative for chills and fever.  HENT: Negative for ear pain and sore throat.   Eyes: Negative for pain and visual disturbance.  Respiratory: Positive for shortness of breath. Negative for cough.   Cardiovascular: Positive for chest pain. Negative for palpitations and leg swelling.  Gastrointestinal: Positive for nausea and vomiting. Negative for abdominal pain.  Genitourinary: Negative for dysuria and hematuria.  Musculoskeletal: Negative for arthralgias and back pain.  Skin: Negative for color change and rash.  Neurological: Negative for seizures and syncope.  All other systems reviewed and are negative.    Physical Exam Updated Vital Signs BP 123/75   Pulse 96   Temp 98.9 F (37.2 C) (Oral)   Resp 18   Ht 5\' 4"  (1.626 m)  Wt 50.3 kg   LMP 09/29/2016   SpO2 100%   Breastfeeding? Unknown   BMI 19.05 kg/m   Physical Exam  Constitutional: She appears well-developed and well-nourished. No distress.  HENT:  Head: Normocephalic and atraumatic.  Eyes: Conjunctivae are normal.  Neck: Neck supple.  Cardiovascular: Regular rhythm and intact distal pulses.   No murmur heard. Tachycardic  Pulmonary/Chest: Effort normal and breath sounds normal. No respiratory distress. She has no wheezes. She has no rales. She exhibits no tenderness.  Abdominal: Soft. She exhibits no distension and no mass. There is no  tenderness. There is no rebound and no guarding.  Musculoskeletal: She exhibits no edema, tenderness or deformity.  Neurological: She is alert.  Skin: Skin is warm and dry.  Psychiatric: She has a normal mood and affect.  Nursing note and vitals reviewed.    ED Treatments / Results  Labs (all labs ordered are listed, but only abnormal results are displayed) Labs Reviewed  CBC - Abnormal; Notable for the following:       Result Value   WBC 16.4 (*)    MCHC 37.4 (*)    All other components within normal limits  COMPREHENSIVE METABOLIC PANEL - Abnormal; Notable for the following:    Sodium 130 (*)    Potassium 2.8 (*)    Chloride 91 (*)    Glucose, Bld 123 (*)    Total Protein 8.3 (*)    All other components within normal limits  TROPONIN I - Abnormal; Notable for the following:    Troponin I 0.03 (*)    All other components within normal limits  D-DIMER, QUANTITATIVE (NOT AT [facility])  URINALYSIS, ROUTINE W REFLEX MICROSCOPIC  MAGNESIUM  I-STAT TROPOININ, ED    EKG  EKG Interpretation None       Radiology Dg Chest 2 View  Result Date: 01/02/2017 CLINICAL DATA:  Shortness of breath, chest pain. Thirteen weeks pregnant. Abdomen was shielded. EXAM: CHEST  2 VIEW COMPARISON:  None. FINDINGS: Heart and mediastinal contours are within normal limits. No focal opacities or effusions. No acute bony abnormality. IMPRESSION: No active cardiopulmonary disease. Electronically Signed   By: Charlett Nose M.D.   On: 01/02/2017 11:06    Procedures Procedures (including critical care time)  Medications Ordered in ED Medications  nitroGLYCERIN (NITROSTAT) 0.4 MG SL tablet (not administered)  aspirin 81 MG chewable tablet (not administered)  nitroGLYCERIN (NITROSTAT) SL tablet 0.4 mg (0.4 mg Sublingual Given 01/02/17 1011)  aspirin chewable tablet 324 mg (324 mg Oral Given 01/02/17 1014)  potassium chloride SA (K-DUR,KLOR-CON) CR tablet 40 mEq (40 mEq Oral Given 01/02/17 1512)     Initial  Impression / Assessment and Plan / ED Course  I have reviewed the triage vital signs and the nursing notes.  Pertinent labs & imaging results that were available during my care of the patient were reviewed by me and considered in my medical decision making (see chart for details).    Patient is a 26 year old, G1 P0, at 13 weeks who presents due to chest pain.  She was seen earlier today at [facility] with the same complaint.  At that time, she had labs drawn which were notable for a white blood cell count of 16,000, sodium 1:30, potassium 2.8, chloride of 91, and troponin of 0.03.  Of note, her d-dimer was 0.32.  She had normal fetal heart tones and was sent here for further evaluation of her chest pain.  Her EKG has shown sinus tachycardia with  fairly diffuse T-wave inversion.  Other than the EKGs from today, there are no prior EKGs available for comparison.  A troponin was repeated in the ED which was negative.  She continues to endorse chest pain.  Her chest pain is pleuritic, also described as pressure-like.  She has nursing of her risk factors for coronary artery disease.  She was ordered given aspirin at [facility].  Prior to arrival, she received potassium IV 10 MEQQ, she was given additional 40 MEQ by mouth here.  Of note, patient did have multiple episodes of emesis yesterday which have resolved.  I suspect this is the etiology of her life flight of normality.  However, given her ongoing chest pain and initially elevated troponin, patient will be admitted to the hospitalist for observation.  I spoke to the hospitalist who will admit the patient.  Final Clinical Impressions(s) / ED Diagnoses   Final diagnoses:  Hypokalemia  Chest pain, unspecified type    New Prescriptions New Prescriptions   No medications on file     Lennette Bihari, MD 01/02/17 1606    Raeford Razor, MD 01/16/17 1301

## 2017-01-02 NOTE — ED Notes (Signed)
Pt vomiting, emesis bag given. Pt requesting O2, stating she felt SOB and had it via EMS and while in previous room. Pt placed on 0.5 L [place]. NAD. A&O

## 2017-01-02 NOTE — ED Notes (Signed)
[facility]  On arrival, pt is 2/10 chest pain, received NTG x 1 PTA - pt reports sudden onset of midsternal chest pain and shortness of breath that woke her from sleep this morning.  Infusing on arrival is K+ with LR.

## 2017-01-02 NOTE — MAU Note (Signed)
Pt presents to MAU with complaints of having pain in her chest since 4am. Reports pressure in her chest. Denies any drug use. Pulse palpated at 167. Denies any vaginal bleeding or abnormal discharge

## 2017-01-02 NOTE — MAU Note (Signed)
Chest pain woke her during the night, pressure in her chest, hurts to swallow.   Felt SOB when walking

## 2017-01-03 DIAGNOSIS — I493 Ventricular premature depolarization: Secondary | ICD-10-CM | POA: Diagnosis present

## 2017-01-03 DIAGNOSIS — R0789 Other chest pain: Secondary | ICD-10-CM | POA: Diagnosis not present

## 2017-01-03 DIAGNOSIS — E860 Dehydration: Secondary | ICD-10-CM | POA: Diagnosis present

## 2017-01-03 DIAGNOSIS — O99411 Diseases of the circulatory system complicating pregnancy, first trimester: Secondary | ICD-10-CM | POA: Diagnosis present

## 2017-01-03 DIAGNOSIS — Z3A13 13 weeks gestation of pregnancy: Secondary | ICD-10-CM | POA: Diagnosis not present

## 2017-01-03 DIAGNOSIS — E871 Hypo-osmolality and hyponatremia: Secondary | ICD-10-CM

## 2017-01-03 DIAGNOSIS — O99611 Diseases of the digestive system complicating pregnancy, first trimester: Secondary | ICD-10-CM | POA: Diagnosis present

## 2017-01-03 DIAGNOSIS — R079 Chest pain, unspecified: Secondary | ICD-10-CM | POA: Diagnosis present

## 2017-01-03 DIAGNOSIS — E0590 Thyrotoxicosis, unspecified without thyrotoxic crisis or storm: Secondary | ICD-10-CM | POA: Diagnosis present

## 2017-01-03 DIAGNOSIS — E876 Hypokalemia: Secondary | ICD-10-CM

## 2017-01-03 DIAGNOSIS — O219 Vomiting of pregnancy, unspecified: Secondary | ICD-10-CM | POA: Diagnosis present

## 2017-01-03 DIAGNOSIS — O99281 Endocrine, nutritional and metabolic diseases complicating pregnancy, first trimester: Secondary | ICD-10-CM | POA: Diagnosis present

## 2017-01-03 DIAGNOSIS — O99111 Other diseases of the blood and blood-forming organs and certain disorders involving the immune mechanism complicating pregnancy, first trimester: Secondary | ICD-10-CM | POA: Diagnosis present

## 2017-01-03 DIAGNOSIS — Z91010 Allergy to peanuts: Secondary | ICD-10-CM | POA: Diagnosis not present

## 2017-01-03 DIAGNOSIS — Z880 Allergy status to penicillin: Secondary | ICD-10-CM | POA: Diagnosis not present

## 2017-01-03 DIAGNOSIS — R000 Tachycardia, unspecified: Secondary | ICD-10-CM | POA: Diagnosis present

## 2017-01-03 DIAGNOSIS — R0602 Shortness of breath: Secondary | ICD-10-CM | POA: Diagnosis not present

## 2017-01-03 DIAGNOSIS — K219 Gastro-esophageal reflux disease without esophagitis: Secondary | ICD-10-CM | POA: Diagnosis present

## 2017-01-03 DIAGNOSIS — Z91018 Allergy to other foods: Secondary | ICD-10-CM | POA: Diagnosis not present

## 2017-01-03 DIAGNOSIS — O26891 Other specified pregnancy related conditions, first trimester: Secondary | ICD-10-CM | POA: Diagnosis present

## 2017-01-03 DIAGNOSIS — D72829 Elevated white blood cell count, unspecified: Secondary | ICD-10-CM | POA: Diagnosis present

## 2017-01-03 LAB — CBC
HCT: 31.3 % — ABNORMAL LOW (ref 36.0–46.0)
HEMATOCRIT: 38.5 % (ref 36.0–46.0)
HEMOGLOBIN: 14.4 g/dL (ref 12.0–15.0)
Hemoglobin: 11.3 g/dL — ABNORMAL LOW (ref 12.0–15.0)
MCH: 30 pg (ref 26.0–34.0)
MCH: 29.9 pg (ref 26.0–34.0)
MCHC: 37.4 g/dL — AB (ref 30.0–36.0)
MCHC: 36.1 g/dL — AB (ref 30.0–36.0)
MCV: 80.2 fL (ref 78.0–100.0)
MCV: 82.8 fL (ref 78.0–100.0)
PLATELETS: 283 10*3/uL (ref 150–400)
Platelets: 366 10*3/uL (ref 150–400)
RBC: 4.8 MIL/uL (ref 3.87–5.11)
RBC: 3.78 MIL/uL — ABNORMAL LOW (ref 3.87–5.11)
RDW: 12.6 % (ref 11.5–15.5)
RDW: 12.7 % (ref 11.5–15.5)
WBC: 16.4 10*3/uL — ABNORMAL HIGH (ref 4.0–10.5)
WBC: 11.3 10*3/uL — ABNORMAL HIGH (ref 4.0–10.5)

## 2017-01-03 LAB — BASIC METABOLIC PANEL
Anion gap: 8 (ref 5–15)
BUN: 5 mg/dL — AB (ref 6–20)
CALCIUM: 8.8 mg/dL — AB (ref 8.9–10.3)
CHLORIDE: 103 mmol/L (ref 101–111)
CO2: 23 mmol/L (ref 22–32)
CREATININE: 0.47 mg/dL (ref 0.44–1.00)
GFR calc non Af Amer: >60 mL/min (ref >60)
GLUCOSE: 110 mg/dL — AB (ref 65–99)
Potassium: 2.9 mmol/L — ABNORMAL LOW (ref 3.5–5.1)
Sodium: 134 mmol/L — ABNORMAL LOW (ref 135–145)

## 2017-01-03 LAB — POTASSIUM: Potassium: 4 mmol/L (ref 3.5–5.1)

## 2017-01-03 LAB — TROPONIN I: Troponin I: <0.03 ng/mL (ref <0.03)

## 2017-01-03 LAB — HIV ANTIBODY (ROUTINE TESTING W REFLEX): HIV Screen 4th Generation wRfx: NONREACTIVE

## 2017-01-03 LAB — TSH: TSH: <0.01 u[IU]/mL — ABNORMAL LOW (ref 0.350–4.500)

## 2017-01-03 MED ORDER — POTASSIUM CHLORIDE IN NACL 20-0.9 MEQ/L-% IV SOLN
INTRAVENOUS | Status: DC
  Administered 2017-01-03 – 2017-01-04 (×2): via INTRAVENOUS
  Filled 2017-01-03 (×4): qty 1000

## 2017-01-03 MED ORDER — POTASSIUM CHLORIDE CRYS ER 20 MEQ PO TBCR
40.0000 meq | EXTENDED_RELEASE_TABLET | ORAL | Status: AC
  Administered 2017-01-03 (×3): 40 meq via ORAL
  Filled 2017-01-03 (×3): qty 2

## 2017-01-03 MED ORDER — DIMENHYDRINATE 50 MG/ML IJ SOLN: 25.0000 mg | Freq: Once | INTRAMUSCULAR | Status: DC

## 2017-01-03 MED ORDER — DIPHENHYDRAMINE HCL 50 MG/ML IJ SOLN
25.0000 mg | Freq: Once | INTRAMUSCULAR | Status: AC
  Administered 2017-01-03: 25 mg via INTRAVENOUS
  Filled 2017-01-03: qty 1

## 2017-01-03 MED ORDER — MAGNESIUM SULFATE 2 GM/50ML IV SOLN
2.0000 g | Freq: Once | INTRAVENOUS | Status: AC
  Administered 2017-01-03: 2 g via INTRAVENOUS
  Filled 2017-01-03: qty 50

## 2017-01-03 MED ORDER — MECLIZINE HCL 25 MG PO TABS
25.0000 mg | ORAL_TABLET | Freq: Two times a day (BID) | ORAL | Status: DC | PRN
  Administered 2017-01-03: 25 mg via ORAL
  Filled 2017-01-03: qty 1

## 2017-01-03 NOTE — Consult Note (Signed)
CARDIOLOGY CONSULT NOTE   Patient ID: Heather Joyce MRN: [id number] DOB/AGE: 01/04/1991 25 y.o.  Admit date: 01/02/2017  Primary Physician   No PCP Per Patient Primary Cardiologist  New Reason for Consultation   Chest pain Requesting Physician  Dr. Benjamine Mola  HPI: Heather Joyce is a 26 y.o. female with no significant PHM who is [redacted] weeks pregnant transferred from [facility] for chest pain.   This is her first pregnancy. Uncomplicated. Patient was in usual state of health up until yesterday morning 3/6 when she woke up with a substernal chest pressure and palpitation. Associated with shortness of breath. Patient states that her pain initially radiated to her neck. At [facility] heart rate noted to be 160's. Not similar to her GERD symptoms. Did not improved with the nitroglycerin or aspirin. His palpitation worsen with sitting up or walking. Her pressure eventually resolved last night, currently complains of mild achiness.  In ER she was noted to be hypokalemic (2.8) and hyponatremic (130). She was given supplement with minimal improvement. K of 2.9 and Na of 134 and Mg of 1.6 this morning. On supplement. Elevated WBC that has been trending down. Chest x-ray without acute cardiopulmonary disease. Echocardiogram showed normal LV function. No shock trauma or wall motion abnormality.  Troponin negative. EKG on presentation shows sinus rhythm with PACs and PVCs at rate of 97 bpm. Nonspecific T-wave inversion in leads 3 and aVF.  Repeat EKG shows sinus tachycardia at rate of 116 bpm T-wave inversion in all inferior leads and leads V4 to V6.  Review of telemetry shows sinus rhythm with intermittent PVCs and PACs. Intermittently rate elevates to 120s.  No cardiac issue as a child. Mother and maternal grandmother has irregular heart rate. Paternal grandfather has a enlarged heart. Liter sister has murmur.   Past Medical History:  Diagnosis Date  . Medical history non-contributory     . Rabies exposure      Past Surgical History:  Procedure Laterality Date  . DENTAL SURGERY    . NO PAST SURGERIES      Allergies  Allergen Reactions  . Peanut-Containing Drug Products Itching  . Penicillins Other (See Comments)    Unknown, Has patient had a PCN reaction causing immediate rash, facial/tongue/throat swelling, SOB or lightheadedness with hypotension: No, whole family is allergic, put on as a precaution Has patient had a PCN reaction causing severe rash involving mucus membranes or skin necrosis: No Has patient had a PCN reaction that required hospitalization No Has patient had a PCN reaction occurring within the last 10 years: No If all of the above answers are "NO", then may proceed with Cephalosporin use.   Marland Kitchen Pineapple Swelling    I have reviewed the patient's current medications . famotidine  20 mg Oral Daily  . magnesium sulfate 1 - 4 g bolus IVPB  2 g Intravenous Once  . potassium chloride  40 mEq Oral Q2H  . prenatal multivitamin  1 tablet Oral Q1200  . sodium chloride flush  3 mL Intravenous Q12H   . dextrose 5 % and 0.9 % NaCl with KCl 20 mEq/L 75 mL/hr at 01/02/17 2239   acetaminophen **OR** acetaminophen  Prior to Admission medications   Medication Sig Start Date End Date Taking? Authorizing Provider  calcium carbonate (TUMS - DOSED IN MG ELEMENTAL CALCIUM) 500 MG chewable tablet Chew 1 tablet by mouth daily as needed for indigestion or heartburn.   Yes Historical Provider, MD  promethazine (PHENERGAN) 12.5 MG tablet  Take 1 tablet (12.5 mg total) by mouth every 6 (six) hours as needed for nausea, vomiting or refractory nausea / vomiting. Patient not taking: Reported on 01/02/2017 11/10/16   Trixie DredgeEmily West, PA-C     Social History   Social History  . Marital status: Single    Spouse name: N/A  . Number of children: N/A  . Years of education: N/A   Occupational History  . Not on file.   Social History Main Topics  . Smoking status: Never Smoker  .  Smokeless tobacco: Never Used  . Alcohol use No  . Drug use: No  . Sexual activity: Yes    Birth control/ protection: Condom   Other Topics Concern  . Not on file   Social History Narrative  . No narrative on file    Family Status  Relation Status  . Mother Alive   As above  ROS:  Full 14 point review of systems complete and found to be negative unless listed above.  Physical Exam: Blood pressure 124/75, pulse 93, temperature 98.1 F (36.7 C), temperature source Oral, resp. rate 15, height 5\' 4"  (1.626 m), weight 116 lb 8 oz (52.8 kg), last menstrual period 09/29/2016, SpO2 100 %, unknown if currently breastfeeding.  General: Well developed, well nourished, female in no acute distress Head: Eyes PERRLA, No xanthomas. Normocephalic and atraumatic, oropharynx without edema or exudate.  Lungs: Resp regular and unlabored, CTA. Heart: regular tachycardic. no s3, s4, or murmurs..   Neck: No carotid bruits. No lymphadenopathy.  No JVD. Abdomen: Bowel sounds present, abdomen soft and non-tender without masses or hernias noted. Msk:  No spine or cva tenderness. No weakness, no joint deformities or effusions. Extremities: No clubbing, cyanosis or edema. DP/PT/Radials 2+ and equal bilaterally. Neuro: Alert and oriented X 3. No focal deficits noted. Psych:  Good affect, responds appropriately Skin: No rashes or lesions noted.  Labs:   Lab Results  Component Value Date   WBC 11.3 (H) 01/03/2017   HGB 11.3 (L) 01/03/2017   HCT 31.3 (L) 01/03/2017   MCV 82.8 01/03/2017   PLT 283 01/03/2017   No results for input(s): INR in the last 72 hours.  Recent Labs Lab 01/02/17 0959 01/03/17 0253  NA 130* 134*  K 2.8* 2.9*  CL 91* 103  CO2 24 23  BUN 8 5*  CREATININE 0.62 0.47  CALCIUM 10.1 8.8*  PROT 8.3*  --   BILITOT 0.6  --   ALKPHOS 42  --   ALT 24  --   AST 35  --   GLUCOSE 123* 110*  ALBUMIN 4.4  --    Magnesium  Date Value Ref Range Status  01/02/2017 1.6 (L) 1.7 -  2.4 mg/dL Final    Recent Labs  16/07/9602/06/18 0959 01/02/17 2134 01/03/17 0253  TROPONINI 0.03* <0.03 <0.03    Recent Labs  01/02/17 1439  TROPIPOC 0.00   No results found for: PROBNP No results found for: CHOL, HDL, LDLCALC, TRIG Lab Results  Component Value Date   DDIMER 0.32 01/02/2017   Lipase  Date/Time Value Ref Range Status  11/10/2016 10:11 AM 33 11 - 51 U/L Final   No results found for: TSH, T4TOTAL, T3FREE, THYROIDAB No results found for: VITAMINB12, FOLATE, FERRITIN, TIBC, IRON, RETICCTPCT  Echo: Study Conclusions  - Left ventricle: The cavity size was normal. Wall thickness was   normal. Systolic function was normal. The estimated ejection   fraction was in the range of 55% to 60%. Wall  motion was normal;   there were no regional wall motion abnormalities. Left   ventricular diastolic function parameters were normal. - Left atrium: The atrium was normal in size. - Inferior vena cava: The vessel was normal in size. The   respirophasic diameter changes were in the normal range (>= 50%),   consistent with normal central venous pressure.    Radiology:  Dg Chest 2 View  Result Date: 01/02/2017 CLINICAL DATA:  Shortness of breath, chest pain. Thirteen weeks pregnant. Abdomen was shielded. EXAM: CHEST  2 VIEW COMPARISON:  None. FINDINGS: Heart and mediastinal contours are within normal limits. No focal opacities or effusions. No acute bony abnormality. IMPRESSION: No active cardiopulmonary disease. Electronically Signed   By: Charlett Nose M.D.   On: 01/02/2017 11:06    ASSESSMENT AND PLAN:     1. Chest pain - In setting of electrolyte abnormality and elevated heart rate. Chest pressure resolved sometime last night. This was lasted greater than 12 hours. Troponin negative so far. Echocardiogram with normal LV function, no structural or wall muscle abnormality. EKG with T-wave inversion in inferior lateral leads. No prior EKG to compare. Will discuss further plan with  M.D. Pending TSH .   2. Hypokalemia and hypomagnesemia - On supplement.   3. Sinus tachycardia - Review of telemetry shows sinus rhythm with intermittent PVCs and PACs. Intermittently rate elevates to 120s. - As above  Signed: Calani Gick, PA 01/03/2017, 7:58 AM Pager [phone]  Co-Sign MD

## 2017-01-03 NOTE — Progress Notes (Signed)
Patient with emesis x1 at this time, continues to dry heave.  Has meclizine PO PRN ordered.  RN text paged [facility] to see if there was anything we could try IV for nausea/vomiting.  RN included in text page that patient is pregnant.

## 2017-01-03 NOTE — Progress Notes (Addendum)
PROGRESS NOTE    Heather Joyce  NWG:[id number] DOB: 07/08/1991 DOA: 01/02/2017 PCP: No PCP Per Patient   Outpatient Specialists:     Brief Narrative:  Heather Joyce is a 26 y.o. female with medical history significant of approximately 13 week pregnancy and no other medical issues.  [facility] that woke her up this AM.  Pain worsens and HR elevates when sitting up and improves when laying down.  Pain is described as heavy, non-radiating, pressure-like pain.  Patient is tender on palpation but not the same pain as in her chest.  No current nausea or  Vomiting-- stopped at about 12 weeks.  Pain not similar to patient's GERD in the past.  Pain did worsen after taking some pills in the ER with water.  Pain was not relived with ASA or nitro.  SOB when sitting up as well.  No fevers  IN the ER: d dimer negative, labs remarkable for hyponatremia, and hypokalemia.  EKG had some non-specific T wave inversions.  Troponin 0.03   Assessment & Plan:   Active Problems:   Chest pain   Hyponatremia   Hypokalemia   Pregnancy   Leukocytosis   Shortness of breath   Chest pain-  -echo normal -CE negative Tele obs -d dimer negative -suspect GERD- patient started on pepcid with good results -tsh low-- will get free T4/free t3-- based on this determine if patient needs to see endocrine now vs follow up labs as low TSH can be related to hyperemesis gravidum, etc  Hyponatremia -IVF  Hypokalemia/hypomagnesemia -replace both -recheck K  Pregnancy -13 weeks with nausea/vomiting-- per up to date-- recommend antivert  Mild leukocytosis -suspect due to dehydration   DVT prophylaxis:  SCD's  Code Status: Full Code   Family Communication: At bedside  Disposition Plan:  D/c in AM if electrolytes better-- patient will need ongoing treatment of GERD   Consultants:   cards   Subjective: Feeling much better occasionally nauseous with  vomiting  Objective: Vitals:   01/02/17 1900 01/02/17 2103 01/02/17 2105 01/03/17 0328  BP: 114/75  114/74 124/75  Pulse: 95  (!) 101 93  Resp: 15  15   Temp:  98.4 F (36.9 C)  98.1 F (36.7 C)  TempSrc:  Oral  Oral  SpO2: 100%  100% 100%  Weight:  52.2 kg (115 lb 1.6 oz)  52.8 kg (116 lb 8 oz)  Height:  5\' 4"  (1.626 m)      Intake/Output Summary (Last 24 hours) at 01/03/17 1058 Last data filed at 01/02/17 2026  Gross per 24 hour  Intake              564 ml  Output                0 ml  Net              564 ml   Filed Weights   01/02/17 0958 01/02/17 2103 01/03/17 0328  Weight: 50.3 kg (111 lb) 52.2 kg (115 lb 1.6 oz) 52.8 kg (116 lb 8 oz)    Examination:  General exam: Appears calm and comfortable  Respiratory system: Clear to auscultation. Respiratory effort normal. Cardiovascular system: S1 & S2 heard, RRR. No JVD, murmurs, rubs, gallops or clicks. No pedal edema. Gastrointestinal system: Abdomen is nondistended, soft and nontender. No organomegaly or masses felt. Normal bowel sounds heard. Central nervous system: Alert and oriented. No focal neurological deficits.  Data Reviewed: I have personally reviewed following labs and imaging studies  CBC:  Recent Labs Lab 01/02/17 0959 01/03/17 0253  WBC 16.4* 11.3*  HGB 14.4 11.3*  HCT 38.5 31.3*  MCV 80.2 82.8  PLT 366 283   Basic Metabolic Panel:  Recent Labs Lab 01/02/17 0959 01/02/17 1555 01/03/17 0253  NA 130*  --  134*  K 2.8*  --  2.9*  CL 91*  --  103  CO2 24  --  23  GLUCOSE 123*  --  110*  BUN 8  --  5*  CREATININE 0.62  --  0.47  CALCIUM 10.1  --  8.8*  MG  --  1.6*  --    GFR: Estimated Creatinine Clearance: 91.2 mL/min (by C-G formula based on SCr of 0.47 mg/dL). Liver Function Tests:  Recent Labs Lab 01/02/17 0959  AST 35  ALT 24  ALKPHOS 42  BILITOT 0.6  PROT 8.3*  ALBUMIN 4.4   No results for input(s): LIPASE, AMYLASE in the last 168 hours. No results for input(s):  AMMONIA in the last 168 hours. Coagulation Profile: No results for input(s): INR, PROTIME in the last 168 hours. Cardiac Enzymes:  Recent Labs Lab 01/02/17 0959 01/02/17 2134 01/03/17 0253  TROPONINI 0.03* <0.03 <0.03   BNP (last 3 results) No results for input(s): PROBNP in the last 8760 hours. HbA1C: No results for input(s): HGBA1C in the last 72 hours. CBG: No results for input(s): GLUCAP in the last 168 hours. Lipid Profile: No results for input(s): CHOL, HDL, LDLCALC, TRIG, CHOLHDL, LDLDIRECT in the last 72 hours. Thyroid Function Tests: No results for input(s): TSH, T4TOTAL, FREET4, T3FREE, THYROIDAB in the last 72 hours. Anemia Panel: No results for input(s): VITAMINB12, FOLATE, FERRITIN, TIBC, IRON, RETICCTPCT in the last 72 hours. Urine analysis:    Component Value Date/Time   COLORURINE AMBER (A) 01/02/2017 1915   APPEARANCEUR HAZY (A) 01/02/2017 1915   LABSPEC 1.031 (H) 01/02/2017 1915   PHURINE 6.0 01/02/2017 1915   GLUCOSEU NEGATIVE 01/02/2017 1915   HGBUR NEGATIVE 01/02/2017 1915   BILIRUBINUR SMALL (A) 01/02/2017 1915   KETONESUR 80 (A) 01/02/2017 1915   PROTEINUR 100 (A) 01/02/2017 1915   UROBILINOGEN 0.2 03/15/2012 1022   NITRITE NEGATIVE 01/02/2017 1915   LEUKOCYTESUR NEGATIVE 01/02/2017 1915     )No results found for this or any previous visit (from the past 240 hour(s)).    Anti-infectives    None       Radiology Studies: Dg Chest 2 View  Result Date: 01/02/2017 CLINICAL DATA:  Shortness of breath, chest pain. Thirteen weeks pregnant. Abdomen was shielded. EXAM: CHEST  2 VIEW COMPARISON:  None. FINDINGS: Heart and mediastinal contours are within normal limits. No focal opacities or effusions. No acute bony abnormality. IMPRESSION: No active cardiopulmonary disease. Electronically Signed   By: Charlett NoseKevin  Dover M.D.   On: 01/02/2017 11:06        Scheduled Meds: . famotidine  20 mg Oral Daily  . potassium chloride  40 mEq Oral Q2H  .  prenatal multivitamin  1 tablet Oral Q1200  . sodium chloride flush  3 mL Intravenous Q12H   Continuous Infusions: . dextrose 5 % and 0.9 % NaCl with KCl 20 mEq/L 75 mL/hr at 01/03/17 1053     LOS: 0 days    Time spent: 25 min    Joal Eakle U Treasure Ingrum, DO [facility] Pager [phone]  If 7PM-7AM, please contact night-coverage [email or web address] Password [email or web address] 01/03/2017, 10:58 AM

## 2017-01-04 DIAGNOSIS — R000 Tachycardia, unspecified: Secondary | ICD-10-CM

## 2017-01-04 LAB — BASIC METABOLIC PANEL
ANION GAP: 6 (ref 5–15)
BUN: <5 mg/dL — ABNORMAL LOW (ref 6–20)
CALCIUM: 8.5 mg/dL — AB (ref 8.9–10.3)
CO2: 18 mmol/L — ABNORMAL LOW (ref 22–32)
CREATININE: 0.31 mg/dL — AB (ref 0.44–1.00)
Chloride: 110 mmol/L (ref 101–111)
GFR calc Af Amer: >60 mL/min (ref >60)
Glucose, Bld: 82 mg/dL (ref 65–99)
Potassium: 3.4 mmol/L — ABNORMAL LOW (ref 3.5–5.1)
Sodium: 134 mmol/L — ABNORMAL LOW (ref 135–145)

## 2017-01-04 LAB — CBC
HCT: 27.6 % — ABNORMAL LOW (ref 36.0–46.0)
Hemoglobin: 9.8 g/dL — ABNORMAL LOW (ref 12.0–15.0)
MCH: 29.7 pg (ref 26.0–34.0)
MCHC: 35.5 g/dL (ref 30.0–36.0)
MCV: 83.6 fL (ref 78.0–100.0)
PLATELETS: 246 10*3/uL (ref 150–400)
RBC: 3.3 MIL/uL — ABNORMAL LOW (ref 3.87–5.11)
RDW: 13.1 % (ref 11.5–15.5)
WBC: 9 10*3/uL (ref 4.0–10.5)

## 2017-01-04 LAB — MAGNESIUM
MAGNESIUM: 1.9 mg/dL (ref 1.7–2.4)
Magnesium: 1.6 mg/dL — ABNORMAL LOW (ref 1.7–2.4)

## 2017-01-04 LAB — T4, FREE: Free T4: 1.75 ng/dL — ABNORMAL HIGH (ref 0.61–1.12)

## 2017-01-04 LAB — POTASSIUM: Potassium: 3.9 mmol/L (ref 3.5–5.1)

## 2017-01-04 MED ORDER — MECLIZINE HCL 25 MG PO TABS: 25.0000 mg | ORAL_TABLET | Freq: Two times a day (BID) | ORAL | 0 refills | Status: DC | PRN

## 2017-01-04 MED ORDER — POTASSIUM CHLORIDE CRYS ER 20 MEQ PO TBCR
40.0000 meq | EXTENDED_RELEASE_TABLET | ORAL | Status: AC
  Administered 2017-01-04 (×3): 40 meq via ORAL
  Filled 2017-01-04 (×3): qty 2

## 2017-01-04 MED ORDER — MAGNESIUM OXIDE 400 (241.3 MG) MG PO TABS: 400.0000 mg | ORAL_TABLET | Freq: Every day | ORAL | 0 refills | Status: DC

## 2017-01-04 MED ORDER — PRENATAL MULTIVITAMIN CH: 1.0000 | ORAL_TABLET | Freq: Every day | ORAL | 0 refills | Status: DC

## 2017-01-04 MED ORDER — MAGNESIUM SULFATE 2 GM/50ML IV SOLN
2.0000 g | Freq: Once | INTRAVENOUS | Status: AC
  Administered 2017-01-04: 2 g via INTRAVENOUS
  Filled 2017-01-04: qty 50

## 2017-01-04 MED ORDER — POTASSIUM CHLORIDE ER 10 MEQ PO TBCR: 10.0000 meq | EXTENDED_RELEASE_TABLET | Freq: Every day | ORAL | 0 refills | Status: DC

## 2017-01-04 MED ORDER — FAMOTIDINE 20 MG PO TABS: 20.0000 mg | ORAL_TABLET | Freq: Every day | ORAL | 0 refills | Status: DC

## 2017-01-04 NOTE — Progress Notes (Signed)
Progress Note  Patient Name: Heather JacobsenMiranda D Stanly Date of Encounter: 01/04/2017  Primary Cardiologist: New to Dr. Mayford Knifeurner  Subjective   Feeling better now. Another episode of emesis last night. No further episode of chest pain or shortness of breath.   Inpatient Medications    Scheduled Meds: . famotidine  20 mg Oral Daily  . potassium chloride  40 mEq Oral Q2H  . prenatal multivitamin  1 tablet Oral Q1200  . sodium chloride flush  3 mL Intravenous Q12H   Continuous Infusions:  PRN Meds: acetaminophen **OR** acetaminophen, meclizine   Vital Signs    Vitals:   01/03/17 0328 01/03/17 1500 01/03/17 2000 01/04/17 0539  BP: 124/75 (!) 102/57 108/65 114/60  Pulse: 93 96 87 91  Resp:  14 16 16   Temp: 98.1 F (36.7 C) 98.8 F (37.1 C) 98.2 F (36.8 C) 98.9 F (37.2 C)  TempSrc: Oral Oral Oral Oral  SpO2: 100% 98% 95% 100%  Weight: 116 lb 8 oz (52.8 kg)   120 lb 6.4 oz (54.6 kg)  Height:        Intake/Output Summary (Last 24 hours) at 01/04/17 0813 Last data filed at 01/03/17 1803  Gross per 24 hour  Intake           668.75 ml  Output                0 ml  Net           668.75 ml   Filed Weights   01/02/17 2103 01/03/17 0328 01/04/17 0539  Weight: 115 lb 1.6 oz (52.2 kg) 116 lb 8 oz (52.8 kg) 120 lb 6.4 oz (54.6 kg)    Telemetry    Sinus rhythm at rate of 70s. Intermittently at rate of 120s - Personally Reviewed  ECG    EKG this morning showed resolution of TWI - Personally Reviewed  Physical Exam   GEN: No acute distress.   Neck: No JVD Cardiac: RRR, no murmurs, rubs, or gallops.  Respiratory: Clear to auscultation bilaterally. GI: Soft, nontender, non-distended  MS: No edema; No deformity. Neuro:  Nonfocal  Psych: Normal affect   Labs    Chemistry Recent Labs Lab 01/02/17 0959 01/03/17 0253 01/03/17 1547 01/04/17 0348  NA 130* 134*  --  134*  K 2.8* 2.9* 4.0 3.4*  CL 91* 103  --  110  CO2 24 23  --  18*  GLUCOSE 123* 110*  --  82  BUN 8 5*   --  <5*  CREATININE 0.62 0.47  --  0.31*  CALCIUM 10.1 8.8*  --  8.5*  PROT 8.3*  --   --   --   ALBUMIN 4.4  --   --   --   AST 35  --   --   --   ALT 24  --   --   --   ALKPHOS 42  --   --   --   BILITOT 0.6  --   --   --   GFRNONAA >60 >60  --  >60  GFRAA >60 >60  --  >60  ANIONGAP 15 8  --  6     Hematology Recent Labs Lab 01/02/17 0959 01/03/17 0253 01/04/17 0348  WBC 16.4* 11.3* 9.0  RBC 4.80 3.78* 3.30*  HGB 14.4 11.3* 9.8*  HCT 38.5 31.3* 27.6*  MCV 80.2 82.8 83.6  MCH 30.0 29.9 29.7  MCHC 37.4* 36.1* 35.5  RDW 12.6 12.7 13.1  PLT 366 283 246    Cardiac Enzymes Recent Labs Lab 01/02/17 0959 01/02/17 2134 01/03/17 0253 01/03/17 0853  TROPONINI 0.03* <0.03 <0.03 <0.03    Recent Labs Lab 01/02/17 1439  TROPIPOC 0.00     BNPNo results for input(s): BNP, PROBNP in the last 168 hours.   DDimer  Recent Labs Lab 01/02/17 0959  DDIMER 0.32     Radiology    Dg Chest 2 View  Result Date: 01/02/2017 CLINICAL DATA:  Shortness of breath, chest pain. Thirteen weeks pregnant. Abdomen was shielded. EXAM: CHEST  2 VIEW COMPARISON:  None. FINDINGS: Heart and mediastinal contours are within normal limits. No focal opacities or effusions. No acute bony abnormality. IMPRESSION: No active cardiopulmonary disease. Electronically Signed   By: Charlett Nose M.D.   On: 01/02/2017 11:06    Cardiac Studies   Echo: Study Conclusions  - Left ventricle: The cavity size was normal. Wall thickness was normal. Systolic function was normal. The estimated ejection fraction was in the range of 55% to 60%. Wall motion was normal; there were no regional wall motion abnormalities. Left ventricular diastolic function parameters were normal. - Left atrium: The atrium was normal in size. - Inferior vena cava: The vessel was normal in size. The respirophasic diameter changes were in the normal range (>= 50%), consistent with normal central venous  pressure.   Patient Profile     Heather Joyce is a 26 y.o. female with no significant PHM who is [redacted] weeks pregnant transferred from [facility] for chest pain.   Assessment & Plan     1. Chest pain - In setting of electrolyte abnormality and elevated heart rate. Chest pressure lasted greater than 12 hours. Troponin negative. Echocardiogram with normal LV function, no structural or wall muscle abnormality. EKG with T-wave inversion in inferior lateral leads initially. This resolved with correction of electrolytes. Symptoms likely related to GERD and Emesis related to pregnancy.   2. Hypokalemia and hypomagnesemia - On supplement. Keep K of at least 4 and Mg of 2.   3. Sinus tachycardia - Review of telemetry shows sinus rhythm with intermittent PVCs and PACs. Intermittently rate elevates to 120s. - Improved. Abnormal thyroid level. Will defer further treatment per primary team.   Signed, Manson Passey, PA  01/04/2017, 8:13 AM

## 2017-01-04 NOTE — Discharge Instructions (Signed)
Chest Wall Pain Chest wall pain is pain in or around the bones and muscles of your chest. Sometimes, an injury causes this pain. Sometimes, the cause may not be known. This pain may take several weeks or longer to get better. Follow these instructions at home: Pay attention to any changes in your symptoms. Take these actions to help with your pain:  Rest as told by your health care provider.  Avoid activities that cause pain. These include any activities that use your chest muscles or your abdominal and side muscles to lift heavy items.  If directed, apply ice to the painful area:  Put ice in a plastic bag.  Place a towel between your skin and the bag.  Leave the ice on for 20 minutes, 2-3 times per day.  Take over-the-counter and prescription medicines only as told by your health care provider.  Do not use tobacco products, including cigarettes, chewing tobacco, and e-cigarettes. If you need help quitting, ask your health care provider.  Keep all follow-up visits as told by your health care provider. This is important. Contact a health care provider if:  You have a fever.  Your chest pain becomes worse.  You have new symptoms. Get help right away if:  You have nausea or vomiting.  You feel sweaty or light-headed.  You have a cough with phlegm (sputum) or you cough up blood.  You develop shortness of breath. This information is not intended to replace advice given to you by your health care provider. Make sure you discuss any questions you have with your health care provider. Document Released: 10/16/2005 Document Revised: 02/24/2016 Document Reviewed: 01/11/2015 Elsevier Interactive Patient Education  2017 [employer].  Hyperthyroidism Hyperthyroidism is when the thyroid is too active (overactive). Your thyroid is a large gland that is located in your neck. The thyroid helps to control how your body uses food (metabolism). When your thyroid is overactive, it produces too  much of a hormone called thyroxine. What are the causes? Causes of hyperthyroidism may include:  Graves disease. This is when your immune system attacks the thyroid gland. This is the most common cause.  Inflammation of the thyroid gland.  Tumor in the thyroid gland or somewhere else.  Excessive use of thyroid medicines, including:  Prescription thyroid supplement.  Herbal supplements that mimic thyroid hormones.  Solid or fluid-filled lumps within your thyroid gland (thyroid nodules).  Excessive ingestion of iodine. What increases the risk?  Being female.  Having a family history of thyroid conditions. What are the signs or symptoms? Signs and symptoms of hyperthyroidism may include:  Nervousness.  Inability to tolerate heat.  Unexplained weight loss.  Diarrhea.  Change in the texture of hair or skin.  Heart skipping beats or making extra beats.  Rapid heart rate.  Loss of menstruation.  Shaky hands.  Fatigue.  Restlessness.  Increased appetite.  Sleep problems.  Enlarged thyroid gland or nodules. How is this diagnosed? Diagnosis of hyperthyroidism may include:  Medical history and physical exam.  Blood tests.  Ultrasound tests. How is this treated? Treatment may include:  Medicines to control your thyroid.  Surgery to remove your thyroid.  Radiation therapy. Follow these instructions at home:  Take medicines only as directed by your health care provider.  Do not use any tobacco products, including cigarettes, chewing tobacco, or electronic cigarettes. If you need help quitting, ask your health care provider.  Do not exercise or do physical activity until your health care provider approves.  Keep  all follow-up appointments as directed by your health care provider. This is important. Contact a health care provider if:  Your symptoms do not get better with treatment.  You have fever.  You are taking thyroid replacement medicine and  you:  Have depression.  Feel mentally and physically slow.  Have weight gain. Get help right away if:  You have decreased alertness or a change in your awareness.  You have abdominal pain.  You feel dizzy.  You have a rapid heartbeat.  You have an irregular heartbeat. This information is not intended to replace advice given to you by your health care provider. Make sure you discuss any questions you have with your health care provider. Document Released: 10/16/2005 Document Revised: 03/16/2016 Document Reviewed: 03/03/2014 Elsevier Interactive Patient Education  2017 [employer]

## 2017-01-04 NOTE — Progress Notes (Signed)
Pt discharged home. Discharge instructions have been gone over with the patient. IV's removed. Pt given unit number and told to call if they have any concerns regarding their discharge instructions. Koi Yarbro V, RN   

## 2017-01-04 NOTE — Discharge Summary (Signed)
Physician Discharge Summary  Heather Joyce WUJ:[id number] DOB: 04/23/1991 DOA: 01/02/2017  PCP: No PCP Per Patient  Admit date: 01/02/2017 Discharge date: 01/04/2017  Time spent: 45 minutes  Recommendations for Outpatient Follow-up:  Patient will be discharged to home.  Patient will need to follow up with primary care provider within one week of discharge, repeat CBC and BMP, magnesium.  Follow up with Dr. Lucianne MussKumar, endocrinology, in 1-2 weeks.  Patient should continue medications as prescribed.  Patient should follow a regular diet.   Discharge Diagnoses:  Chest pain Hyperthyroidism Hyponatremia Hypokalemia/hypomagnesemia Pregnancy Mild leukocytosis  Discharge Condition: Stable  Diet recommendation: regular  Filed Weights   01/02/17 2103 01/03/17 0328 01/04/17 0539  Weight: 52.2 kg (115 lb 1.6 oz) 52.8 kg (116 lb 8 oz) 54.6 kg (120 lb 6.4 oz)    History of present illness:  On 01/02/2017 by Dr. Judyann MunsonJessica Joyce [name] is a 26 y.o. female with medical history significant of approximately 13 week pregnancy and no other medical issues.  [facility] that woke her up this AM.  Pain worsens and HR elevates when sitting up and improves when laying down.  Pain is described as heavy, non-radiating, pressure-like pain.  Patient is tender on palpation but not the same pain as in her chest.  No current nausea or  Vomiting-- stopped at about 12 weeks.  Pain not similar to patient's GERD in the past.  Pain did worsen after taking some pills in the ER with water.  Pain was not relived with ASA or nitro.  SOB when sitting up as well.  No fevers  IN the ER: d dimer negative, labs remarkable for hyponatremia, and hypokalemia.  EKG had some non-specific T wave inversions.  Troponin 0.03  Hospital Course:  Chest pain -Troponins cycled and unremarkable -Ddimer negative -Echocardiogram EF 55-60%, normal study -Cardiology consulted and appreciated, feels this  is likely related to GERD -Patient's chest pain has resolved since being on pepcid  Hyperthyroidism -TSH <0.010, FT4 1.75 -FT3 pending -spoke with endocrinologist, Dr. Lucianne MussKumar, who suggested repeating tests in a few weeks given her nausea/vomiting, acute illness and that patient is asymptomatic (patient currently not tachycardic-however has had episodes of tachycardia, which may be related to her pregnancy). Patient may follow up with him. -If treatment is needed, PTU would be the drug of choice.   Hyponatremia -Improved with IVF -repeat BMP in one week  Hypokalemia/hypomagnesemia -Replaced, Mag 1.9, K3.9 -repeat BMP in one week -Take supplements for one week  Pregnancy -13 weeks with nausea/vomiting-- per up to date-- recommend antivert  Mild leukocytosis -suspect due to dehydration -Resolved  Procedures: Echocardiogram  Consultations: Cardiology Endocrinology, Dr. Lucianne MussKumar, via phone  Discharge Exam: Vitals:   01/03/17 2000 01/04/17 0539  BP: 108/65 114/60  Pulse: 87 91  Resp: 16 16  Temp: 98.2 F (36.8 C) 98.9 F (37.2 C)   Patient denies chest pain, palpitations, shortness of breath, abdominal pain, futher N/V.    General: Well developed, well nourished, NAD, appears stated age  HEENT: NCAT, mucous membranes moist.  Cardiovascular: S1 S2 auscultated, no rubs, murmurs or gallops. Regular rate and rhythm.  Respiratory: Clear to auscultation bilaterally with equal chest rise  Abdomen: Soft, nontender, nondistended, + bowel sounds  Extremities: warm dry without cyanosis clubbing or edema  Neuro: AAOx3, nonfocal  Psych: Normal affect and demeanor with intact judgement and insight  Discharge Instructions Discharge Instructions    Discharge instructions  Complete by:  As directed    Patient will be discharged to home.  Patient will need to follow up with primary care provider within one week of discharge, repeat CBC and BMP.  Follow up with Dr. Lucianne Muss,  endocrinology, in 1-2 weeks.  Patient should continue medications as prescribed.  Patient should follow a regular diet.     Current Discharge Medication List    START taking these medications   Details  famotidine (PEPCID) 20 MG tablet Take 1 tablet (20 mg total) by mouth daily. Qty: 30 tablet, Refills: 0    meclizine (ANTIVERT) 25 MG tablet Take 1 tablet (25 mg total) by mouth 2 (two) times daily as needed for nausea. Qty: 60 tablet, Refills: 0    Prenatal Vit-Fe Fumarate-FA (PRENATAL MULTIVITAMIN) TABS tablet Take 1 tablet by mouth daily at 12 noon. Qty: 30 tablet, Refills: 0      CONTINUE these medications which have NOT CHANGED   Details  calcium carbonate (TUMS - DOSED IN MG ELEMENTAL CALCIUM) 500 MG chewable tablet Chew 1 tablet by mouth daily as needed for indigestion or heartburn.      STOP taking these medications     promethazine (PHENERGAN) 12.5 MG tablet        Allergies  Allergen Reactions  . Peanut-Containing Drug Products Itching  . Penicillins Other (See Comments)    Unknown, Has patient had a PCN reaction causing immediate rash, facial/tongue/throat swelling, SOB or lightheadedness with hypotension: No, whole family is allergic, put on as a precaution Has patient had a PCN reaction causing severe rash involving mucus membranes or skin necrosis: No Has patient had a PCN reaction that required hospitalization No Has patient had a PCN reaction occurring within the last 10 years: No If all of the above answers are "NO", then may proceed with Cephalosporin use.   Marland Kitchen Pineapple Swelling   Follow-up Information    Primary care provider. Schedule an appointment as soon as possible for a visit in 1 week(s).   Why:  Hospital follow up       [facility], MD. Schedule an appointment as soon as possible for a visit in 1 week(s).   Specialty:  Endocrinology Why:  Hospital follow up, thyroid testing Contact information: [address] [place]  [address] [phone]            The results of significant diagnostics from this hospitalization (including imaging, microbiology, ancillary and laboratory) are listed below for reference.    Significant Diagnostic Studies: Dg Chest 2 View  Result Date: 01/02/2017 CLINICAL DATA:  Shortness of breath, chest pain. Thirteen weeks pregnant. Abdomen was shielded. EXAM: CHEST  2 VIEW COMPARISON:  None. FINDINGS: Heart and mediastinal contours are within normal limits. No focal opacities or effusions. No acute bony abnormality. IMPRESSION: No active cardiopulmonary disease. Electronically Signed   By: Charlett Nose M.D.   On: 01/02/2017 11:06    Microbiology: No results found for this or any previous visit (from the past 240 hour(s)).   Labs: Basic Metabolic Panel:  Recent Labs Lab 01/02/17 0959 01/02/17 1555 01/03/17 0253 01/03/17 1547 01/04/17 0348 01/04/17 0730  NA 130*  --  134*  --  134*  --   K 2.8*  --  2.9* 4.0 3.4*  --   CL 91*  --  103  --  110  --   CO2 24  --  23  --  18*  --   GLUCOSE 123*  --  110*  --  82  --   BUN 8  --  5*  --  <5*  --   CREATININE 0.62  --  0.47  --  0.31*  --   CALCIUM 10.1  --  8.8*  --  8.5*  --   MG  --  1.6*  --   --   --  1.6*   Liver Function Tests:  Recent Labs Lab 01/02/17 0959  AST 35  ALT 24  ALKPHOS 42  BILITOT 0.6  PROT 8.3*  ALBUMIN 4.4   No results for input(s): LIPASE, AMYLASE in the last 168 hours. No results for input(s): AMMONIA in the last 168 hours. CBC:  Recent Labs Lab 01/02/17 0959 01/03/17 0253 01/04/17 0348  WBC 16.4* 11.3* 9.0  HGB 14.4 11.3* 9.8*  HCT 38.5 31.3* 27.6*  MCV 80.2 82.8 83.6  PLT 366 283 246   Cardiac Enzymes:  Recent Labs Lab 01/02/17 0959 01/02/17 2134 01/03/17 0253 01/03/17 0853  TROPONINI 0.03* <0.03 <0.03 <0.03   BNP: BNP (last 3 results) No results for input(s): BNP in the last 8760 hours.  ProBNP (last 3 results) No results for input(s): PROBNP in the last 8760  hours.  CBG: No results for input(s): GLUCAP in the last 168 hours.     SignedEdsel Petrin  [facility] 01/04/2017, 1:04 PM

## 2017-01-05 LAB — T3, FREE: T3, Free: 3.6 pg/mL (ref 2.0–4.4)

## 2017-06-07 LAB — OB RESULTS CONSOLE GBS: GBS: NEGATIVE

## 2017-07-04 ENCOUNTER — Inpatient Hospital Stay (HOSPITAL_COMMUNITY)
Admission: AD | Admit: 2017-07-04 | Discharge: 2017-07-07 | DRG: 775 | Disposition: A | Discharge status: Home / Self Care | Payer: Medicaid Other | Source: Ambulatory Visit | Attending: Obstetrics and Gynecology | Admitting: Obstetrics and Gynecology

## 2017-07-04 ENCOUNTER — Encounter (HOSPITAL_COMMUNITY): Payer: Self-pay

## 2017-07-04 DIAGNOSIS — O139 Gestational [pregnancy-induced] hypertension without significant proteinuria, unspecified trimester: Secondary | ICD-10-CM | POA: Diagnosis present

## 2017-07-04 DIAGNOSIS — O9962 Diseases of the digestive system complicating childbirth: Secondary | ICD-10-CM | POA: Diagnosis present

## 2017-07-04 DIAGNOSIS — O133 Gestational [pregnancy-induced] hypertension without significant proteinuria, third trimester: Secondary | ICD-10-CM

## 2017-07-04 DIAGNOSIS — O134 Gestational [pregnancy-induced] hypertension without significant proteinuria, complicating childbirth: Principal | ICD-10-CM | POA: Diagnosis present

## 2017-07-04 DIAGNOSIS — Z3A39 39 weeks gestation of pregnancy: Secondary | ICD-10-CM | POA: Diagnosis not present

## 2017-07-04 DIAGNOSIS — K219 Gastro-esophageal reflux disease without esophagitis: Secondary | ICD-10-CM | POA: Diagnosis present

## 2017-07-04 DIAGNOSIS — Z880 Allergy status to penicillin: Secondary | ICD-10-CM | POA: Diagnosis not present

## 2017-07-04 LAB — COMPREHENSIVE METABOLIC PANEL
ALBUMIN: 3.4 g/dL — AB (ref 3.5–5.0)
ALT: 10 U/L — ABNORMAL LOW (ref 14–54)
ANION GAP: 11 (ref 5–15)
AST: 16 U/L (ref 15–41)
Alkaline Phosphatase: 120 U/L (ref 38–126)
BILIRUBIN TOTAL: 0.6 mg/dL (ref 0.3–1.2)
BUN: 7 mg/dL (ref 6–20)
CALCIUM: 8.9 mg/dL (ref 8.9–10.3)
CO2: 19 mmol/L — ABNORMAL LOW (ref 22–32)
CREATININE: 0.44 mg/dL (ref 0.44–1.00)
Chloride: 104 mmol/L (ref 101–111)
GFR calc Af Amer: >60 mL/min (ref >60)
Glucose, Bld: 80 mg/dL (ref 65–99)
POTASSIUM: 3.6 mmol/L (ref 3.5–5.1)
Sodium: 134 mmol/L — ABNORMAL LOW (ref 135–145)
TOTAL PROTEIN: 7.4 g/dL (ref 6.5–8.1)

## 2017-07-04 LAB — CBC
HCT: 34.4 % — ABNORMAL LOW (ref 36.0–46.0)
HEMOGLOBIN: 11.8 g/dL — AB (ref 12.0–15.0)
MCH: 28.6 pg (ref 26.0–34.0)
MCHC: 34.3 g/dL (ref 30.0–36.0)
MCV: 83.3 fL (ref 78.0–100.0)
PLATELETS: 297 10*3/uL (ref 150–400)
RBC: 4.13 MIL/uL (ref 3.87–5.11)
RDW: 13.9 % (ref 11.5–15.5)
WBC: 8.8 10*3/uL (ref 4.0–10.5)

## 2017-07-04 LAB — ABO/RH: ABO/RH(D): B POS

## 2017-07-04 LAB — PROTEIN / CREATININE RATIO, URINE
CREATININE, URINE: 69 mg/dL
PROTEIN CREATININE RATIO: 0.12 mg/mg{creat} (ref 0.00–0.15)
TOTAL PROTEIN, URINE: 8 mg/dL

## 2017-07-04 LAB — TYPE AND SCREEN
ABO/RH(D): B POS
ANTIBODY SCREEN: NEGATIVE

## 2017-07-04 MED ORDER — OXYCODONE-ACETAMINOPHEN 5-325 MG PO TABS: 1.0000 | ORAL_TABLET | ORAL | Status: DC | PRN

## 2017-07-04 MED ORDER — ACETAMINOPHEN 325 MG PO TABS
650.0000 mg | ORAL_TABLET | ORAL | Status: DC | PRN
Start: 2017-07-04 — End: 2017-07-05

## 2017-07-04 MED ORDER — FLEET ENEMA 7-19 GM/118ML RE ENEM
1.0000 | ENEMA | RECTAL | Status: DC | PRN
Start: 2017-07-04 — End: 2017-07-05

## 2017-07-04 MED ORDER — FENTANYL CITRATE (PF) 100 MCG/2ML IJ SOLN
100.0000 ug | INTRAMUSCULAR | Status: DC | PRN
  Administered 2017-07-05 (×2): 100 ug via INTRAVENOUS
  Filled 2017-07-04 (×2): qty 2

## 2017-07-04 MED ORDER — OXYTOCIN 40 UNITS IN LACTATED RINGERS INFUSION - SIMPLE MED: 1.0000 m[IU]/min | INTRAVENOUS | Status: DC

## 2017-07-04 MED ORDER — ZOLPIDEM TARTRATE 5 MG PO TABS
5.0000 mg | ORAL_TABLET | Freq: Every evening | ORAL | Status: DC | PRN
  Administered 2017-07-04: 5 mg via ORAL
  Filled 2017-07-04: qty 1

## 2017-07-04 MED ORDER — LACTATED RINGERS IV SOLN: 500.0000 mL | INTRAVENOUS | Status: DC | PRN

## 2017-07-04 MED ORDER — LIDOCAINE HCL (PF) 1 % IJ SOLN
30.0000 mL | INTRAMUSCULAR | Status: DC | PRN
  Filled 2017-07-04: qty 30

## 2017-07-04 MED ORDER — OXYTOCIN BOLUS FROM INFUSION
500.0000 mL | Freq: Once | INTRAVENOUS | Status: AC
  Administered 2017-07-05: 500 mL via INTRAVENOUS

## 2017-07-04 MED ORDER — OXYTOCIN 40 UNITS IN LACTATED RINGERS INFUSION - SIMPLE MED
2.5000 [IU]/h | INTRAVENOUS | Status: DC
  Administered 2017-07-05: 2.5 [IU]/h via INTRAVENOUS

## 2017-07-04 MED ORDER — ACETAMINOPHEN 325 MG PO TABS: 650.0000 mg | ORAL_TABLET | Freq: Once | ORAL | Status: DC

## 2017-07-04 MED ORDER — ONDANSETRON HCL 4 MG/2ML IJ SOLN: 4.0000 mg | Freq: Four times a day (QID) | INTRAMUSCULAR | Status: DC | PRN

## 2017-07-04 MED ORDER — LACTATED RINGERS IV SOLN
INTRAVENOUS | Status: DC
  Administered 2017-07-04 – 2017-07-05 (×2): via INTRAVENOUS

## 2017-07-04 MED ORDER — SOD CITRATE-CITRIC ACID 500-334 MG/5ML PO SOLN: 30.0000 mL | ORAL | Status: DC | PRN

## 2017-07-04 MED ORDER — MISOPROSTOL 200 MCG PO TABS
50.0000 ug | ORAL_TABLET | ORAL | Status: DC | PRN
  Administered 2017-07-04: 50 ug via ORAL
  Filled 2017-07-04: qty 1

## 2017-07-04 MED ORDER — TERBUTALINE SULFATE 1 MG/ML IJ SOLN
0.2500 mg | Freq: Once | INTRAMUSCULAR | Status: AC | PRN
  Administered 2017-07-05: 0.25 mg via SUBCUTANEOUS

## 2017-07-04 MED ORDER — TERBUTALINE SULFATE 1 MG/ML IJ SOLN
0.2500 mg | Freq: Once | INTRAMUSCULAR | Status: DC | PRN
  Filled 2017-07-04: qty 1

## 2017-07-04 MED ORDER — OXYCODONE-ACETAMINOPHEN 5-325 MG PO TABS: 2.0000 | ORAL_TABLET | ORAL | Status: DC | PRN

## 2017-07-04 NOTE — MAU Note (Signed)
Pt has been having ctx all day and now are 5-6 min apart. No LOF or bleeding.

## 2017-07-04 NOTE — Progress Notes (Signed)
Patient ID: Heather Joyce, female   DOB: 08-Apr-1991, 26 y.o.   MRN: [id number]  Feels well; denies H/A or s/s pre-e  BPs 130/93, 135/90, other VSS FHR 120s, +accels, no decels, occ variables Ctx irreg q 3-6 mins w/ interspersed irritability Cx 1+/50/-3  IUP@term  gHTN (nl labs, no symptoms or severe range BPs) GBS neg  Cervical foley placed without difficulty and vag cyto placed simultaneously Plan Pit when foley comes out Anticipate SVD  Cam HaiSHAW, KIMBERLY CNM 07/04/2017 9:37 PM

## 2017-07-04 NOTE — H&P (Signed)
OBSTETRIC ADMISSION HISTORY AND PHYSICAL  OURANIA HAMLER is a 26 y.o. female G2P0010 with IUP at [redacted]w[redacted]d by LMP presenting for IOL secondary to new-onset gestational hypertension. She reports +FMs, No LOF, no VB, no blurry vision, or peripheral edema or RUQ pain.  She does endorse a mild headache currently.  She plans on bottle feeding. She request OCPs for birth control. [facility]   Dating: By LMP --->  Estimated Date of Delivery: 07/06/17  Sono:    @ [redacted]w[redacted]d, CWD, normal anatomy   Prenatal History/Complications: Low-risk pregnancy  Past Medical History: Past Medical History:  Diagnosis Date  . Asthma   . Rabies exposure     Past Surgical History: Past Surgical History:  Procedure Laterality Date  . DENTAL SURGERY    . NO PAST SURGERIES      Obstetrical History: OB History    Gravida Para Term Preterm AB Living   2       1     SAB TAB Ectopic Multiple Live Births   1              Social History: Social History   Social History  . Marital status: Single    Spouse name: N/A  . Number of children: N/A  . Years of education: N/A   Social History Main Topics  . Smoking status: Never Smoker  . Smokeless tobacco: Never Used  . Alcohol use No  . Drug use: No  . Sexual activity: Yes    Birth control/ protection: Condom   Other Topics Concern  . None   Social History Narrative  . None    Family History: Family History  Problem Relation Age of Onset  . Hypertension Father   . Hypertension Paternal Grandfather   . Cancer Neg Hx     Allergies: Allergies  Allergen Reactions  . Penicillins Anaphylaxis and Other (See Comments)    Has patient had a PCN reaction causing immediate rash, facial/tongue/throat swelling, SOB or lightheadedness with hypotension: Yes Has patient had a PCN reaction causing severe rash involving mucus membranes or skin necrosis: No Has patient had a PCN reaction that required hospitalization:  No Has patient had a PCN reaction occurring within the last 10 years: No If all of the above answers are "NO", then may proceed with Cephalosporin use.   Marland Kitchen Pineapple Anaphylaxis  . Food Rash and Other (See Comments)    Pt is allergic to peanuts    Prescriptions Prior to Admission  Medication Sig Dispense Refill Last Dose  . calcium carbonate (TUMS - DOSED IN MG ELEMENTAL CALCIUM) 500 MG chewable tablet Chew 1 tablet by mouth 4 (four) times daily as needed for indigestion or heartburn.    Past Month at Unknown time  . famotidine (PEPCID) 20 MG tablet Take 1 tablet (20 mg total) by mouth daily. (Patient taking differently: Take 20 mg by mouth daily as needed for heartburn or indigestion. ) 30 tablet 0 Past Week at Unknown time  . meclizine (ANTIVERT) 25 MG tablet Take 1 tablet (25 mg total) by mouth 2 (two) times daily as needed for nausea. 60 tablet 0 Past Week at Unknown time  . Prenatal Vit-Fe Fumarate-FA (PRENATAL MULTIVITAMIN) TABS tablet Take 1 tablet by mouth daily at 12 noon. (Patient taking differently: Take 1 tablet by mouth daily. ) 30 tablet 0 07/04/2017 at Unknown time     Review of Systems   All systems reviewed and negative except as  stated in HPI  Blood pressure 135/90, pulse 78, temperature 99 F (37.2 C), temperature source Oral, resp. rate 16, height 5\' 4"  (1.626 m), weight 73.9 kg (163 lb), last menstrual period 09/29/2016, unknown if currently breastfeeding. General appearance: alert and cooperative Lungs: clear to auscultation bilaterally Heart: regular rate and rhythm Abdomen: soft, non-tender; bowel sounds normal Pelvic: SVE 2cm/50%/-3 Extremities: Homans sign is negative, no sign of DVT Presentation: cephalic Fetal monitoring baseline 140, moderate variability, +accels, no decels Uterine activity q2-4 mins Dilation: 2 Effacement (%): 50 Station: -3 Exam by:: Mary [place]az[place] Johnson, RN and Allena Napoleon, MD   Prenatal labs: ABO, Rh: --/--/B POS (09/05  1822) Antibody: NEG (09/05 1822) Rubella: Immune (02/12 0000) RPR: Nonreactive (02/12 0000)  HBsAg: Negative (02/12 0000)  HIV: Non-reactive (02/12 0000)  GBS: Negative (08/09 0000)  1 hr Glucola 133 Genetic screening  Quad screen negative Anatomy US normal female  Prenatal Transfer Tool  Maternal Diabetes: No Genetic Screening: Normal Maternal Ultrasounds/Referrals: Normal Fetal Ultrasounds or other Referrals:  None Maternal Substance Abuse:  Yes:  Type: Smoker Significant Maternal Medications:  None Significant Maternal Lab Results: None  Results for orders placed or performed during the hospital encounter of 07/04/17 (from the past 24 hour(s))  Protein / creatinine ratio, urine   Collection Time: 07/04/17  2:12 PM  Result Value Ref Range   Creatinine, Urine 69.00 mg/dL   Total Protein, Urine 8 mg/dL   Protein Creatinine Ratio 0.12 0.00 - 0.15 mg/mg[Cre]  Comprehensive metabolic panel   Collection Time: 07/04/17  4:53 PM  Result Value Ref Range   Sodium 134 (L) 135 - 145 mmol/L   Potassium 3.6 3.5 - 5.1 mmol/L   Chloride 104 101 - 111 mmol/L   CO2 19 (L) 22 - 32 mmol/L   Glucose, Bld 80 65 - 99 mg/dL   BUN 7 6 - 20 mg/dL   Creatinine, Ser 5.78 0.44 - 1.00 mg/dL   Calcium 8.9 8.9 - 46.9 mg/dL   Total Protein 7.4 6.5 - 8.1 g/dL   Albumin 3.4 (L) 3.5 - 5.0 g/dL   AST 16 15 - 41 U/L   ALT 10 (L) 14 - 54 U/L   Alkaline Phosphatase 120 38 - 126 U/L   Total Bilirubin 0.6 0.3 - 1.2 mg/dL   GFR calc non Af Amer >60 >60 mL/min   GFR calc Af Amer >60 >60 mL/min   Anion gap 11 5 - 15  CBC   Collection Time: 07/04/17  4:53 PM  Result Value Ref Range   WBC 8.8 4.0 - 10.5 K/uL   RBC 4.13 3.87 - 5.11 MIL/uL   Hemoglobin 11.8 (L) 12.0 - 15.0 g/dL   HCT 62.9 (L) 52.8 - 41.3 %   MCV 83.3 78.0 - 100.0 fL   MCH 28.6 26.0 - 34.0 pg   MCHC 34.3 30.0 - 36.0 g/dL   RDW 24.4 01.0 - 27.2 %   Platelets 297 150 - 400 K/uL  Type and screen [facility]    Collection  Time: 07/04/17  6:22 PM  Result Value Ref Range   ABO/RH(D) B POS    Antibody Screen NEG    Sample Expiration 07/07/2017     Patient Active Problem List   Diagnosis Date Noted  . Gestational hypertension 07/04/2017  . Shortness of breath   . [redacted] weeks gestation of pregnancy   . Gastroesophageal reflux disease   . Chest pain 01/02/2017  . Hyponatremia 01/02/2017  . Hypokalemia 01/02/2017  .  Pregnancy 01/02/2017  . Leukocytosis 01/02/2017    Assessment/Plan:  Leota JacobsenMiranda D Alameda is a 26 y.o. G2P0010 at 4061w5d here for IOL for gestational hypertension.  Labs are unremarkable.    #gHTN: new-onset, mild range BPs currently, labs reassuring, give tylenol for headache and monitor closely #Labor: contracting regularly and too frequently for cytotec, plan to place FB #Pain: IV pain meds and epidural if desired #FWB: Category 1 #ID:  GBS negative #MOF: Bottle #MOC: OCPs #Circ:  N/A  Larene BeachMary K Key, DO  PGY-2 07/04/2017, 8:29 PM  OB FELLOW HISTORY AND PHYSICAL ATTESTATION  I confirm that I have verified the information documented in the resident's note and that I have also personally reperformed the physical exam and all medical decision making activities. I agree with above documentation and have made edits as needed.   Caryl AdaJazma Jahari Billy OB Fellow 07/04/2017, 8:42 PM

## 2017-07-04 NOTE — Anesthesia Pain Management Evaluation Note (Signed)
  CRNA Pain Management Visit Note  Patient: Heather Joyce, 26 y.o., female  "Hello [facility] am a [facility] the [facility]. We have an anesthesia team available at all times to provide care throughout the hospital, including epidural management and anesthesia for C-section. I don't know your plan for the delivery whether it a natural birth, water birth, IV sedation, nitrous supplementation, doula or epidural, but we want to meet your pain goals."   1.Was your pain managed to your expectations on prior hospitalizations?   No prior hospitalizations  2.What is your expectation for pain management during this hospitalization?     Epidural  3.How can we help you reach that goal? Epidural when ready.  Record the patient's initial score and the patient's pain goal.   Pain: 6  Pain Goal: 8 [facility] wants you to be able to say your pain was always managed very well.  Doug Bucklin L 07/04/2017

## 2017-07-05 ENCOUNTER — Encounter (HOSPITAL_COMMUNITY): Payer: Self-pay | Admitting: *Deleted

## 2017-07-05 ENCOUNTER — Inpatient Hospital Stay (HOSPITAL_COMMUNITY): Payer: Medicaid Other | Admitting: Anesthesiology

## 2017-07-05 DIAGNOSIS — O134 Gestational [pregnancy-induced] hypertension without significant proteinuria, complicating childbirth: Secondary | ICD-10-CM

## 2017-07-05 DIAGNOSIS — Z3A39 39 weeks gestation of pregnancy: Secondary | ICD-10-CM

## 2017-07-05 LAB — CBC
HEMATOCRIT: 32.9 % — AB (ref 36.0–46.0)
HEMOGLOBIN: 11.3 g/dL — AB (ref 12.0–15.0)
MCH: 28.4 pg (ref 26.0–34.0)
MCHC: 34.3 g/dL (ref 30.0–36.0)
MCV: 82.7 fL (ref 78.0–100.0)
Platelets: 244 10*3/uL (ref 150–400)
RBC: 3.98 MIL/uL (ref 3.87–5.11)
RDW: 13.9 % (ref 11.5–15.5)
WBC: 11 10*3/uL — AB (ref 4.0–10.5)

## 2017-07-05 LAB — RPR: RPR Ser Ql: NONREACTIVE

## 2017-07-05 MED ORDER — PHENYLEPHRINE 40 MCG/ML (10ML) SYRINGE FOR IV PUSH (FOR BLOOD PRESSURE SUPPORT)
80.0000 ug | PREFILLED_SYRINGE | INTRAVENOUS | Status: DC | PRN
  Filled 2017-07-05: qty 5

## 2017-07-05 MED ORDER — COCONUT OIL OIL: 1.0000 "application " | TOPICAL_OIL | Status: DC | PRN

## 2017-07-05 MED ORDER — ZOLPIDEM TARTRATE 5 MG PO TABS: 5.0000 mg | ORAL_TABLET | Freq: Every evening | ORAL | Status: DC | PRN

## 2017-07-05 MED ORDER — DIPHENHYDRAMINE HCL 25 MG PO CAPS: 25.0000 mg | ORAL_CAPSULE | Freq: Four times a day (QID) | ORAL | Status: DC | PRN

## 2017-07-05 MED ORDER — ONDANSETRON HCL 4 MG PO TABS: 4.0000 mg | ORAL_TABLET | ORAL | Status: DC | PRN

## 2017-07-05 MED ORDER — OXYTOCIN 40 UNITS IN LACTATED RINGERS INFUSION - SIMPLE MED
1.0000 m[IU]/min | INTRAVENOUS | Status: DC
  Administered 2017-07-05: 1 m[IU]/min via INTRAVENOUS
  Filled 2017-07-05: qty 1000

## 2017-07-05 MED ORDER — FENTANYL 2.5 MCG/ML BUPIVACAINE 1/10 % EPIDURAL INFUSION (WH - ANES)
14.0000 mL/h | INTRAMUSCULAR | Status: DC | PRN
  Administered 2017-07-05 (×2): 14 mL/h via EPIDURAL
  Filled 2017-07-05 (×2): qty 100

## 2017-07-05 MED ORDER — LACTATED RINGERS IV SOLN: 500.0000 mL | Freq: Once | INTRAVENOUS | Status: DC

## 2017-07-05 MED ORDER — TETANUS-DIPHTH-ACELL PERTUSSIS 5-2.5-18.5 LF-MCG/0.5 IM SUSP: 0.5000 mL | Freq: Once | INTRAMUSCULAR | Status: DC

## 2017-07-05 MED ORDER — DIPHENHYDRAMINE HCL 50 MG/ML IJ SOLN: 12.5000 mg | INTRAMUSCULAR | Status: DC | PRN

## 2017-07-05 MED ORDER — SENNOSIDES-DOCUSATE SODIUM 8.6-50 MG PO TABS
2.0000 | ORAL_TABLET | ORAL | Status: DC
  Administered 2017-07-05 – 2017-07-06 (×2): 2 via ORAL
  Filled 2017-07-05 (×2): qty 2

## 2017-07-05 MED ORDER — DIBUCAINE 1 % RE OINT: 1.0000 "application " | TOPICAL_OINTMENT | RECTAL | Status: DC | PRN

## 2017-07-05 MED ORDER — EPHEDRINE 5 MG/ML INJ
10.0000 mg | INTRAVENOUS | Status: DC | PRN
  Filled 2017-07-05: qty 2

## 2017-07-05 MED ORDER — SIMETHICONE 80 MG PO CHEW: 80.0000 mg | CHEWABLE_TABLET | ORAL | Status: DC | PRN

## 2017-07-05 MED ORDER — WITCH HAZEL-GLYCERIN EX PADS: 1.0000 "application " | MEDICATED_PAD | CUTANEOUS | Status: DC | PRN

## 2017-07-05 MED ORDER — BENZOCAINE-MENTHOL 20-0.5 % EX AERO: 1.0000 "application " | INHALATION_SPRAY | CUTANEOUS | Status: DC | PRN

## 2017-07-05 MED ORDER — ACETAMINOPHEN 325 MG PO TABS: 650.0000 mg | ORAL_TABLET | ORAL | Status: DC | PRN

## 2017-07-05 MED ORDER — PHENYLEPHRINE 40 MCG/ML (10ML) SYRINGE FOR IV PUSH (FOR BLOOD PRESSURE SUPPORT)
80.0000 ug | PREFILLED_SYRINGE | INTRAVENOUS | Status: DC | PRN
  Filled 2017-07-05: qty 5
  Filled 2017-07-05 (×2): qty 10

## 2017-07-05 MED ORDER — IBUPROFEN 600 MG PO TABS
600.0000 mg | ORAL_TABLET | Freq: Four times a day (QID) | ORAL | Status: DC
  Administered 2017-07-05 – 2017-07-07 (×8): 600 mg via ORAL
  Filled 2017-07-05 (×8): qty 1

## 2017-07-05 MED ORDER — ONDANSETRON HCL 4 MG/2ML IJ SOLN: 4.0000 mg | INTRAMUSCULAR | Status: DC | PRN

## 2017-07-05 MED ORDER — LIDOCAINE HCL (PF) 1 % IJ SOLN
INTRAMUSCULAR | Status: DC | PRN
  Administered 2017-07-05: 6 mL via EPIDURAL
  Administered 2017-07-05: 4 mL

## 2017-07-05 MED ORDER — PRENATAL MULTIVITAMIN CH
1.0000 | ORAL_TABLET | Freq: Every day | ORAL | Status: DC
  Administered 2017-07-05 – 2017-07-06 (×2): 1 via ORAL
  Filled 2017-07-05 (×2): qty 1

## 2017-07-05 NOTE — Anesthesia Procedure Notes (Signed)
Epidural Patient location during procedure: OB  Staffing Anesthesiologist: Raksha Wolfgang  Preanesthetic Checklist Completed: patient identified, pre-op evaluation, timeout performed, IV checked, risks and benefits discussed and monitors and equipment checked  Epidural Patient position: sitting Prep: DuraPrep Patient monitoring: blood pressure and continuous pulse ox Approach: right paramedian Location: L3-L4 Injection technique: LOR air  Needle:  Needle type: Tuohy  Needle gauge: 17 G Needle insertion depth: 6 cm Catheter type: closed end flexible Catheter size: 19 Gauge Catheter at skin depth: 12 cm Test dose: negative  Assessment Sensory level: T8  Additional Notes    Dosing of Epidural:  1st dose, through catheter .............................................  Xylocaine 40 mg  2nd dose, through catheter, after waiting 3 minutes.........Xylocaine 60 mg    As each dose occurred, patient was free of IV sx; and patient exhibited no evidence of SA injection.  Patient is more comfortable after epidural dosed. Please see RN's note for documentation of vital signs,and FHR which are stable.  Patient reminded not to try to ambulate with numb legs, and that an RN must be present when she attempts to get up.         

## 2017-07-05 NOTE — Progress Notes (Signed)
Heather JacobsenMiranda D Joyce is a 26 y.o. G2P0010 at 111w6d by LMP admitted for induction of labor due to gestational hypertension.  Subjective:  Patient seen at bedside. No complaints of HA, vision changes or any other s/s of pre-eclampsia. No other complaints at this time.   Objective: BP 124/81   Pulse 76   Temp 98.5 F (36.9 C)   Resp 16   Ht 5\' 4"  (1.626 m)   Wt 73.9 kg (163 lb)   LMP 09/29/2016   BMI 27.98 kg/m  No intake/output data recorded. No intake/output data recorded.  FHT:  FHR: 140 bpm, variability: moderate,  accelerations:  Present,  decelerations:  Present prolonged variables UC:   irregular, every 2-4 minutes SVE:   Dilation: 1.5 Effacement (%): 50 Station: -3 Exam by:: Philipp DeputyKim Shaw, CNM  Labs: Lab Results  Component Value Date   WBC 8.8 07/04/2017   HGB 11.8 (L) 07/04/2017   HCT 34.4 (L) 07/04/2017   MCV 83.3 07/04/2017   PLT 297 07/04/2017    Assessment / Plan: Induction of labor due to gestational hypertension  Labor: IOL process; plan to start Pit @ 0200 1x1 due to earlier variables, Preeclampsia:  no signs or symptoms of toxicity Pain Control:  IV pain meds, may consider epidural later I/D:  gbs negative Anticipated MOD:  NSVD  Anna Genrelay Carter, PA-S 07/05/2017, 1:26 AM   I confirm that I have verified the information documented in the PA student's note and that I have also personally reperformed the physical exam and all medical decision making activities.  Cam HaiSHAW, KIMBERLY 07/05/2017 2:47 AM

## 2017-07-05 NOTE — Lactation Note (Signed)
This note was copied from a baby's chart. Lactation Consultation Note  Dad holding infant in arms.  Mom lying in bed.  LC introduced herself after congratulating her on her baby.  Mom seemed very tired and rolled over in bed.  LC asked if mom would rather her come back after she rested, pt. Replied yes.  Lactation number left on board for mom to call when she is ready.   Patient Name: Heather Joyce: 07/05/2017     Maternal Data    Feeding    LATCH Score                   Interventions    Lactation Tools Discussed/Used     Consult Status      [facility] 07/05/2017, 1:05 PM

## 2017-07-05 NOTE — Anesthesia Postprocedure Evaluation (Signed)
Anesthesia Post Note  Patient: Heather JacobsenMiranda D Joyce  Procedure(s) Performed: * No procedures listed *     Patient location during evaluation: Mother Baby Anesthesia Type: Epidural Level of consciousness: awake and alert Pain management: pain level controlled Vital Signs Assessment: post-procedure vital signs reviewed and stable Respiratory status: spontaneous breathing, nonlabored ventilation and respiratory function stable Cardiovascular status: stable Postop Assessment: no headache, no backache and epidural receding Anesthetic complications: no    Last Vitals:  Vitals:   07/05/17 0930 07/05/17 1030  BP: 121/79 (!) 146/75  Pulse: 93 78  Resp: 18 16  Temp: 36.9 C 37 C  SpO2:      Last Pain:  Vitals:   07/05/17 1030  TempSrc: Oral  PainSc:    Pain Goal:                 EchoStarMERRITT,[name]

## 2017-07-05 NOTE — Progress Notes (Signed)
Heather Joyce is a 26 y.o. G2P0010 at 1120w6d by LMP admitted for induction of labor due to gestational hypertension.  Subjective:  Patient seen at bedside. No complaints of HA, vision changes or any other s/s of pre-eclampsia. Comfortable with epidural.     Objective: BP 125/68   Pulse (!) 101   Temp 98 F (36.7 C)   Resp 19   Ht 5\' 4"  (1.626 m)   Wt 73.9 kg (163 lb)   LMP 09/29/2016   SpO2 99%   BMI 27.98 kg/m  No intake/output data recorded. No intake/output data recorded.  FHT:  FHR: 130 bpm, variability: moderate,  accelerations:  Present,  decelerations:  Absent UC:   every 2-4 minutes SVE:   Dilation: 8 Effacement (%): 100 Station: 0 Exam by::  Tammi Klippel(A. WINFREY, MD)  Labs: Lab Results  Component Value Date   WBC 11.0 (H) 07/05/2017   HGB 11.3 (L) 07/05/2017   HCT 32.9 (L) 07/05/2017   MCV 82.7 07/05/2017   PLT 244 07/05/2017    Assessment / Plan: Induction of labor due to gestational hypertension  Labor: Due to prolonged variable decelerations, pitocin is being infused at 1 milliunits/min currently.  FHR appears more reassuring at this time.  AROM @ 0650 Preeclampsia:  no signs or symptoms of toxicity Pain Control:  Epidural I/D:  gbs negative Anticipated MOD:  NSVD  Amanda C. Frances FurbishWinfrey, MD PGY-1, [facility] 07/05/2017 6:30 AM

## 2017-07-05 NOTE — Anesthesia Preprocedure Evaluation (Signed)
Anesthesia Evaluation  Patient identified by MRN, date of birth, ID band Patient awake    Reviewed: Allergy & Precautions, H&P , Patient's Chart, lab work & pertinent test results  Airway Mallampati: II  TM Distance: >3 FB Neck ROM: full    Dental  (+) Teeth Intact   Pulmonary    breath sounds clear to auscultation       Cardiovascular  Rhythm:regular Rate:Normal     Neuro/Psych    GI/Hepatic   Endo/Other    Renal/GU      Musculoskeletal   Abdominal   Peds  Hematology   Anesthesia Other Findings   No hx of asthma per pt    Reproductive/Obstetrics (+) Pregnancy                             Anesthesia Physical Anesthesia Plan  ASA: II  Anesthesia Plan: Epidural   Post-op Pain Management:    Induction:   PONV Risk Score and Plan:   Airway Management Planned:   Additional Equipment:   Intra-op Plan:   Post-operative Plan:   Informed Consent: I have reviewed the patients History and Physical, chart, labs and discussed the procedure including the risks, benefits and alternatives for the proposed anesthesia with the patient or authorized representative who has indicated his/her understanding and acceptance.   Dental Advisory Given  Plan Discussed with:   Anesthesia Plan Comments: (Labs checked- platelets confirmed with RN in room. Fetal heart tracing, per RN, reported to be stable enough for sitting procedure. Discussed epidural, and patient consents to the procedure:  included risk of possible headache,backache, failed block, allergic reaction, and nerve injury. This patient was asked if she had any questions or concerns before the procedure started.)        Anesthesia Quick Evaluation

## 2017-07-06 NOTE — Plan of Care (Signed)
Problem: Bowel/Gastric: Goal: Gastrointestinal status will improve Outcome: Progressing Pt reports last bowel movement was the day of delivery, 07/05/17.  Pt reports passing gas.  Pt bowel sounds hypoactive, abdomen soft and non distended.  Pt encouraged to ambulate as tolerated.

## 2017-07-06 NOTE — Lactation Note (Signed)
This note was copied from a baby's chart. Lactation Consultation Note Mom has decided to just formula feed per RN.  Patient Name: Heather Aletha HalimMiranda Buss ZOXWR'UToday's Date: 07/06/2017 Reason for consult: Follow-up assessment   Maternal Data    Feeding    LATCH Score                   Interventions    Lactation Tools Discussed/Used     Consult Status Consult Status: Complete Date: 07/06/17    Charyl DancerCARVER, Islay Polanco G 07/06/2017, 1:50 AM

## 2017-07-06 NOTE — Plan of Care (Signed)
Problem: Nutritional: Goal: Dietary intake will improve Outcome: Progressing Mom encouraged to feed baby within formula guidelines, which were reviewed with her.

## 2017-07-06 NOTE — Progress Notes (Signed)
Post Partum Day 1 Subjective: no complaints and + flatus. No bm. No other complaints at this time.  Objective: Blood pressure 117/63, pulse 76, temperature 98 F (36.7 C), resp. rate 16, height 5\' 4"  (1.626 m), weight 73.9 kg (163 lb), last menstrual period 09/29/2016, SpO2 99 %, unknown if currently breastfeeding.  Physical Exam:  General: alert and cooperative Lochia: appropriate Uterine Fundus: firm DVT Evaluation: No evidence of DVT seen on physical exam. No significant calf/ankle edema.   Recent Labs  07/04/17 1653 07/05/17 0400  HGB 11.8* 11.3*  HCT 34.4* 32.9*    Assessment/Plan: Plan for discharge tomorrow   LOS: 2 days   Anna GenreClay Carter 07/06/2017, 7:08 AM  I have seen and examined this patient and agree with the management plan.

## 2017-07-06 NOTE — Progress Notes (Signed)
UR chart review completed.  

## 2017-07-07 MED ORDER — IBUPROFEN 600 MG PO TABS: 600.0000 mg | ORAL_TABLET | Freq: Four times a day (QID) | ORAL | 0 refills | Status: DC | PRN

## 2017-07-07 NOTE — Discharge Instructions (Signed)

## 2017-07-07 NOTE — Discharge Summary (Signed)
OB Discharge Summary     Patient Name: Heather Joyce DOB: October 02, 1991 MRN: [id number]  Date of admission: 07/04/2017 Delivering MD: Cam Hai D   Date of discharge: 07/07/2017  Admitting diagnosis: 39wks CTX 5 to Intrauterine pregnancy: [redacted]w[redacted]d     Secondary diagnosis:  Active Problems:   SVD (spontaneous vaginal delivery)  Additional problems:  Patient Active Problem List   Diagnosis Date Noted  . SVD (spontaneous vaginal delivery) 07/05/2017  . Shortness of breath   . [redacted] weeks gestation of pregnancy   . Gastroesophageal reflux disease   . Chest pain 01/02/2017  . Hyponatremia 01/02/2017  . Hypokalemia 01/02/2017  . Pregnancy 01/02/2017  . Leukocytosis 01/02/2017        Discharge diagnosis: Term Pregnancy Delivered and Gestational Hypertension                                                                                                Post partum procedures:None  Augmentation: Pitocin, Cytotec and Foley Balloon  Complications: None  Hospital course:  Induction of Labor With Vaginal Delivery   26 y.o. yo G2P1011 at [redacted]w[redacted]d was admitted to the hospital 07/04/2017 for induction of labor.  Indication for induction: Gestational hypertension.  Patient had an uncomplicated labor course as follows: Membrane Rupture Time/Date: 6:51 AM ,07/05/2017   Intrapartum Procedures: Episiotomy: None [1]                                         Lacerations:  None [1]  Patient had delivery of a Viable infant.  Information for the patient's newborn:  Heather Joyce, Heather Joyce [[id number]]  Delivery Method: Vag-Spont   07/05/2017  Details of delivery can be found in separate delivery note.  Patient had a routine postpartum course. BPs remained normal. Patient is discharged home 07/07/17.  Physical exam  Vitals:   07/06/17 0930 07/06/17 1724 07/06/17 1856 07/07/17 0530  BP: 128/86 134/90 121/75 120/68  Pulse: 92 79  80  Resp: Temp: 98.2 F (36.8 C) 98.5 F (36.9 C)  98.4 F  (36.9 C)  TempSrc:  Oral  Oral  SpO2:    99%  Weight:      Height:       General: alert, cooperative and no distress Lochia: appropriate Uterine Fundus: firm Incision: N/A DVT Evaluation: No evidence of DVT seen on physical exam. Labs: Lab Results  Component Value Date   WBC 11.0 (H) 07/05/2017   HGB 11.3 (L) 07/05/2017   HCT 32.9 (L) 07/05/2017   MCV 82.7 07/05/2017   PLT 244 07/05/2017   CMP Latest Ref Rng & Units 07/04/2017  Glucose 65 - 99 mg/dL 80  BUN 6 - 20 mg/dL 7  Creatinine 7.82 - 9.56 mg/dL 2.13  Sodium 086 - 578 mmol/L 134(L)  Potassium 3.5 - 5.1 mmol/L 3.6  Chloride 101 - 111 mmol/L 104  CO2 22 - 32 mmol/L 19(L)  Calcium 8.9 - 10.3 mg/dL 8.9  Total Protein 6.5 - 8.1  g/dL 7.4  Total Bilirubin 0.3 - 1.2 mg/dL 0.6  Alkaline Phos 38 - 126 U/L 120  AST 15 - 41 U/L 16  ALT 14 - 54 U/L 10(L)    Discharge instruction: per After Visit Summary and "Baby and Me Booklet".  After visit meds:  Allergies as of 07/07/2017      Reactions   Penicillins Anaphylaxis, Other (See Comments)   Has patient had a PCN reaction causing immediate rash, facial/tongue/throat swelling, SOB or lightheadedness with hypotension: Yes Has patient had a PCN reaction causing severe rash involving mucus membranes or skin necrosis: No Has patient had a PCN reaction that required hospitalization: No Has patient had a PCN reaction occurring within the last 10 years: No If all of the above answers are "NO", then may proceed with Cephalosporin use.   Pineapple Anaphylaxis   Food Rash, Other (See Comments)   Pt is allergic to peanuts      Medication List    TAKE these medications   calcium carbonate 500 MG chewable tablet Commonly known as:  TUMS - dosed in mg elemental calcium Chew 1 tablet by mouth 4 (four) times daily as needed for indigestion or heartburn.   famotidine 20 MG tablet Commonly known as:  PEPCID Take 1 tablet (20 mg total) by mouth daily. What changed:  when to take  this  reasons to take this   ibuprofen 600 MG tablet Commonly known as:  ADVIL,MOTRIN Take 1 tablet (600 mg total) by mouth every 6 (six) hours as needed for moderate pain or cramping.   meclizine 25 MG tablet Commonly known as:  ANTIVERT Take 1 tablet (25 mg total) by mouth 2 (two) times daily as needed for nausea.   prenatal multivitamin Tabs tablet Take 1 tablet by mouth daily at 12 noon. What changed:  when to take this            Discharge Care Instructions        Start     Ordered   07/07/17 0000  ibuprofen (ADVIL,MOTRIN) 600 MG tablet  Every 6 hours PRN     07/07/17 0742   07/04/17 0000  OB RESULT CONSOLE Group B Strep    Comments:  This external order was created through the Results Console.   07/04/17 1954   07/04/17 0000  OB RESULTS CONSOLE GC/Chlamydia    Comments:  This external order was created through the Results Console.   07/04/17 1954   07/04/17 0000  OB RESULTS CONSOLE RPR    Comments:  This external order was created through the Results Console.    07/04/17 1954   07/04/17 0000  OB RESULTS CONSOLE HIV antibody    Comments:  This external order was created through the Results Console.    07/04/17 1954   07/04/17 0000  OB RESULTS CONSOLE Rubella Antibody    Comments:  This external order was created through the Results Console.    07/04/17 1954   07/04/17 0000  OB RESULTS CONSOLE Hepatitis B surface antigen    Comments:  This external order was created through the Results Console.    07/04/17 1954      Diet: routine diet  Activity: Advance as tolerated. Pelvic rest for 6 weeks.   Outpatient follow up:1 week for blood pressure check and 4-6 weeks for postpartum visit Follow up Appt:No future appointments. Follow up Visit: Follow-up Information    Department, [facility] Follow up.   Why:  Please call and schedule blood  pressure check in 1 week and postpartum visit in 4-6 weeks. Contact information: [address] [place] [id number] [phone]           Postpartum contraception: Combination OCPs  Newborn Data: Live born female  Birth Weight: 6 lb 8.8 oz (2970 g) APGAR: 7, 9  Baby Feeding: Bottle Disposition:home with mother   07/07/2017 Heather BeachMary K Key, Heather Joyce PGY-2  OB FELLOW DISCHARGE ATTESTATION  I have seen and examined this patient. I agree with above documentation and have made edits as needed.   Heather AdaJazma Perlie Scheuring, Heather Joyce OB Fellow 11:12 AM

## 2017-07-09 ENCOUNTER — Encounter (HOSPITAL_COMMUNITY): Payer: Self-pay | Admitting: *Deleted

## 2018-02-21 ENCOUNTER — Encounter (HOSPITAL_COMMUNITY): Payer: Self-pay | Admitting: Emergency Medicine

## 2018-02-21 ENCOUNTER — Emergency Department (HOSPITAL_COMMUNITY)
Admission: EM | Admit: 2018-02-21 | Discharge: 2018-02-21 | Disposition: A | Discharge status: Home / Self Care | Payer: Medicaid Other | Attending: Emergency Medicine | Admitting: Emergency Medicine

## 2018-02-21 DIAGNOSIS — Z5321 Procedure and treatment not carried out due to patient leaving prior to being seen by health care provider: Secondary | ICD-10-CM | POA: Insufficient documentation

## 2018-02-21 DIAGNOSIS — R109 Unspecified abdominal pain: Secondary | ICD-10-CM | POA: Insufficient documentation

## 2018-02-21 LAB — COMPREHENSIVE METABOLIC PANEL
ALT: 15 U/L (ref 14–54)
ANION GAP: 12 (ref 5–15)
AST: 18 U/L (ref 15–41)
Albumin: 4.7 g/dL (ref 3.5–5.0)
Alkaline Phosphatase: 57 U/L (ref 38–126)
BILIRUBIN TOTAL: 0.5 mg/dL (ref 0.3–1.2)
BUN: 10 mg/dL (ref 6–20)
CALCIUM: 9.9 mg/dL (ref 8.9–10.3)
CO2: 20 mmol/L — ABNORMAL LOW (ref 22–32)
Chloride: 107 mmol/L (ref 101–111)
Creatinine, Ser: 0.71 mg/dL (ref 0.44–1.00)
GFR calc Af Amer: >60 mL/min (ref >60)
Glucose, Bld: 123 mg/dL — ABNORMAL HIGH (ref 65–99)
POTASSIUM: 3.9 mmol/L (ref 3.5–5.1)
Sodium: 139 mmol/L (ref 135–145)
TOTAL PROTEIN: 8.7 g/dL — AB (ref 6.5–8.1)

## 2018-02-21 LAB — CBC
HEMATOCRIT: 39.3 % (ref 36.0–46.0)
HEMOGLOBIN: 13.4 g/dL (ref 12.0–15.0)
MCH: 29.6 pg (ref 26.0–34.0)
MCHC: 34.1 g/dL (ref 30.0–36.0)
MCV: 86.9 fL (ref 78.0–100.0)
Platelets: 364 10*3/uL (ref 150–400)
RBC: 4.52 MIL/uL (ref 3.87–5.11)
RDW: 13.6 % (ref 11.5–15.5)
WBC: 15.9 10*3/uL — ABNORMAL HIGH (ref 4.0–10.5)

## 2018-02-21 LAB — I-STAT BETA HCG BLOOD, ED (MC, WL, AP ONLY)

## 2018-02-21 LAB — LIPASE, BLOOD: Lipase: 27 U/L (ref 11–51)

## 2018-02-21 MED ORDER — ONDANSETRON 4 MG PO TBDP
4.0000 mg | ORAL_TABLET | Freq: Once | ORAL | Status: DC | PRN
  Filled 2018-02-21: qty 1

## 2018-02-21 NOTE — ED Triage Notes (Signed)
Per , N/V/D since 0630-states she has vomited a lot today-possible a reaction to THC-EMS states she smelled like marijuana

## 2018-02-21 NOTE — ED Notes (Signed)
Pt is actively vomiting in lobby.

## 2018-02-21 NOTE — ED Notes (Signed)
Pt is aware a urine sample is needed, but is unable to provide one at this time. Pt provided with specimen cup and instructions.

## 2018-03-14 ENCOUNTER — Ambulatory Visit (HOSPITAL_COMMUNITY)
Admission: EM | Admit: 2018-03-14 | Discharge: 2018-03-14 | Disposition: A | Discharge status: Home / Self Care | Payer: Medicaid Other | Attending: Family Medicine | Admitting: Family Medicine

## 2018-03-14 ENCOUNTER — Encounter (HOSPITAL_COMMUNITY): Payer: Self-pay | Admitting: Family Medicine

## 2018-03-14 DIAGNOSIS — R112 Nausea with vomiting, unspecified: Secondary | ICD-10-CM

## 2018-03-14 DIAGNOSIS — R197 Diarrhea, unspecified: Secondary | ICD-10-CM

## 2018-03-14 DIAGNOSIS — R531 Weakness: Secondary | ICD-10-CM

## 2018-03-14 DIAGNOSIS — R42 Dizziness and giddiness: Secondary | ICD-10-CM

## 2018-03-14 MED ORDER — ONDANSETRON HCL 4 MG/2ML IJ SOLN
INTRAMUSCULAR | Status: AC
  Filled 2018-03-14: qty 2

## 2018-03-14 MED ORDER — SODIUM CHLORIDE 0.9 % IV BOLUS
1000.0000 mL | Freq: Once | INTRAVENOUS | Status: AC
  Administered 2018-03-14: 1000 mL via INTRAVENOUS

## 2018-03-14 MED ORDER — ONDANSETRON HCL 4 MG/2ML IJ SOLN
4.0000 mg | Freq: Once | INTRAMUSCULAR | Status: AC
  Administered 2018-03-14: 4 mg via INTRAMUSCULAR

## 2018-03-14 MED ORDER — METOCLOPRAMIDE HCL 5 MG/ML IJ SOLN
5.0000 mg | Freq: Once | INTRAMUSCULAR | Status: AC
  Administered 2018-03-14: 5 mg via INTRAVENOUS

## 2018-03-14 MED ORDER — DIPHENHYDRAMINE HCL 50 MG/ML IJ SOLN
INTRAMUSCULAR | Status: AC
  Filled 2018-03-14: qty 1

## 2018-03-14 MED ORDER — ONDANSETRON 4 MG PO TBDP: 4.0000 mg | ORAL_TABLET | Freq: Three times a day (TID) | ORAL | 0 refills | Status: DC | PRN

## 2018-03-14 MED ORDER — DIPHENHYDRAMINE HCL 50 MG/ML IJ SOLN
25.0000 mg | Freq: Once | INTRAMUSCULAR | Status: AC
Start: 2018-03-14 — End: 2018-03-14
  Administered 2018-03-14: 25 mg via INTRAVENOUS

## 2018-03-14 MED ORDER — METOCLOPRAMIDE HCL 5 MG/ML IJ SOLN
INTRAMUSCULAR | Status: AC
  Filled 2018-03-14: qty 2

## 2018-03-14 NOTE — ED Provider Notes (Addendum)
[facility]    CSN: [id number] Arrival date & time: 03/14/18  1311     History   Chief Complaint Chief Complaint  Patient presents with  . Emesis    HPI Heather Joyce is a 27 y.o. female.   Heather Joyce presents with complaints of nausea, vomiting and diarrhea which started two days ago and has been persistent. States she has had intermittent episodes of similar, last was 4/25 and went to hospital, left without being seen. No known triggers- no recent travel or course of contaminated or spoiled food/water. Denies abdominal pain. States she has mild dizziness and weakness. Denies blood to stool. States she has had some pink tinge to emesis. Anything she eats she vomits and is unable to drink fluids. States has not urinated today. On menstrual period. Denies urinary symptoms. No fevers. She is not breast feeding. Hx of asthma.    ROS per HPI.      Past Medical History:  Diagnosis Date  . Asthma   . 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 SVD (spontaneous vaginal delivery) 07/05/2017  . Shortness of breath   . [redacted] weeks gestation of pregnancy   . Gastroesophageal reflux disease   . Chest pain 01/02/2017  . Hyponatremia 01/02/2017  . Hypokalemia 01/02/2017  . Pregnancy 01/02/2017  . Leukocytosis 01/02/2017    Past Surgical History:  Procedure Laterality Date  . DENTAL SURGERY    . NO PAST SURGERIES      OB History    Gravida  2   Para  1   Term  1   Preterm      AB  1   Living  1     SAB  1   TAB      Ectopic      Multiple  0   Live Births  1            Home Medications    Prior to Admission medications   Medication Sig Start Date End Date Taking? Authorizing Provider  calcium carbonate (TUMS - DOSED IN MG ELEMENTAL CALCIUM) 500 MG chewable tablet Chew 1 tablet by mouth 4 (four) times daily as needed for indigestion or heartburn.     [provider]  famotidine (PEPCID) 20 MG tablet  Take 1 tablet (20 mg total) by mouth daily. Patient taking differently: Take 20 mg by mouth daily as needed for heartburn or indigestion.  01/05/17   Mikhail, Nita Sells, DO  ibuprofen (ADVIL,MOTRIN) 600 MG tablet Take 1 tablet (600 mg total) by mouth every 6 (six) hours as needed for moderate pain or cramping. 07/07/17   Key, Jearld Lesch, DO  meclizine (ANTIVERT) 25 MG tablet Take 1 tablet (25 mg total) by mouth 2 (two) times daily as needed for nausea. 01/04/17   Mikhail, Nita Sells, DO  ondansetron (ZOFRAN-ODT) 4 MG disintegrating tablet Take 1 tablet (4 mg total) by mouth every 8 (eight) hours as needed for nausea or vomiting. 03/14/18   Georgetta Haber, NP  Prenatal Vit-Fe Fumarate-FA (PRENATAL MULTIVITAMIN) TABS tablet Take 1 tablet by mouth daily at 12 noon. Patient taking differently: Take 1 tablet by mouth daily.  01/05/17   Edsel Petrin, DO    Family History Family History  Problem Relation Age of Onset  . Hypertension Father   . Hypertension Paternal Grandfather   . Cancer Neg Hx     Social History Social History   Tobacco Use  .  Smoking status: Never Smoker  . Smokeless tobacco: Never Used  Substance Use Topics  . Alcohol use: No  . Drug use: No     Allergies   Penicillins; Pineapple; and Food   Review of Systems Review of Systems   Physical Exam Triage Vital Signs ED Triage Vitals  Enc Vitals Group     BP 03/14/18 1327 (!) 134/92     Pulse Rate 03/14/18 1327 67     Resp 03/14/18 1327 18     Temp 03/14/18 1327 98.2 F (36.8 C)     Temp src --      SpO2 03/14/18 1327 98 %     Weight --      Height --      Head Circumference --      Peak Flow --      Pain Score 03/14/18 1326 6     Pain Loc --      Pain Edu? --      Excl. in GC? --    No data found.  Updated Vital Signs BP (!) 134/92   Pulse 67   Temp 98.2 F (36.8 C)   Resp 18   LMP 03/14/2018   SpO2 98%    Physical Exam  Constitutional: She is oriented to person, place, and time. She appears  well-developed and well-nourished. No distress.  Cardiovascular: Normal rate, regular rhythm and normal heart sounds.  Pulmonary/Chest: Effort normal and breath sounds normal.  Abdominal: Soft. There is tenderness in the epigastric area. There is no rigidity, no rebound, no guarding, no tenderness at McBurney's point and negative Murphy's sign.  Mild right cva tenderness; actively vomiting yellow emesis  Neurological: She is alert and oriented to person, place, and time.  Skin: Skin is warm and dry.     UC Treatments / Results  Labs (all labs ordered are listed, but only abnormal results are displayed) Labs Reviewed - No data to display  EKG None  Radiology No results found.  Procedures Procedures (including critical care time)  Medications Ordered in UC Medications  sodium chloride 0.9 % bolus 1,000 mL (1,000 mLs Intravenous New Bag/Given 03/14/18 1421)  ondansetron (ZOFRAN) injection 4 mg (4 mg Intramuscular Given 03/14/18 1421)  metoCLOPramide (REGLAN) injection 5 mg (5 mg Intravenous Given 03/14/18 1443)  diphenhydrAMINE (BENADRYL) injection 25 mg (25 mg Intravenous Given 03/14/18 1457)  ondansetron (ZOFRAN) injection 4 mg (4 mg Intramuscular Given 03/14/18 1457)    Initial Impression / Assessment and Plan / UC Course  I have reviewed the triage vital signs and the nursing notes.  Pertinent labs & imaging results that were available during my care of the patient were reviewed by me and considered in my medical decision making (see chart for details).    Actively vomiting on initial presentation. Hemodynamically stable. Symptoms started two days ago but had similar two weeks ago. Fluid bolus provided. Patient states she is unable to provide urine sample but adamantly has to leave department. Patient still with heaving but states has to leave to get formula to her baby and can no longer stay. Return precautions provided. Has been seen for similar presentation in the past. Without  red flag findings or concern for acute abdomen. Encouraged close follow up with PCP as this has been recurrent for patient. Patient verbalized understanding and agreeable to plan.    Final Clinical Impressions(s) / UC Diagnoses   Final diagnoses:  Nausea vomiting and diarrhea     Discharge Instructions  Small frequent sips of fluids. Please follow up with PCP if these episodes persist. If develop worsening of symptoms, dizziness, pain, dehydration please go to Er.     ED Prescriptions    Medication Sig Dispense Auth. Provider   ondansetron (ZOFRAN-ODT) 4 MG disintegrating tablet Take 1 tablet (4 mg total) by mouth every 8 (eight) hours as needed for nausea or vomiting. 12 tablet Georgetta Haber, NP     Controlled Substance Prescriptions [place] Controlled Substance Registry consulted? Not Applicable   Georgetta Haber, NP 03/14/18 1508    Georgetta Haber, NP 03/14/18 [phone]

## 2018-03-14 NOTE — Discharge Instructions (Signed)
Small frequent sips of fluids. Please follow up with PCP if these episodes persist. If develop worsening of symptoms, dizziness, pain, dehydration please go to Er.

## 2018-03-14 NOTE — ED Notes (Signed)
Pt unable to provide sample for lab

## 2018-03-14 NOTE — ED Triage Notes (Signed)
Pt here for N,V,D and burning in chest and abdomen  since Tuesday. She is requesting something different than Zofran for nausea. Reports that it doesn't work for her.

## 2018-03-16 ENCOUNTER — Other Ambulatory Visit: Payer: Self-pay

## 2018-03-16 ENCOUNTER — Encounter (HOSPITAL_COMMUNITY): Payer: Self-pay

## 2018-03-16 ENCOUNTER — Emergency Department (HOSPITAL_COMMUNITY)
Admission: EM | Admit: 2018-03-16 | Discharge: 2018-03-16 | Disposition: A | Discharge status: Home / Self Care | Payer: Self-pay | Attending: Emergency Medicine | Admitting: Emergency Medicine

## 2018-03-16 ENCOUNTER — Emergency Department (HOSPITAL_COMMUNITY): Payer: Self-pay

## 2018-03-16 DIAGNOSIS — R112 Nausea with vomiting, unspecified: Secondary | ICD-10-CM | POA: Insufficient documentation

## 2018-03-16 DIAGNOSIS — K2100 Gastro-esophageal reflux disease with esophagitis, without bleeding: Secondary | ICD-10-CM

## 2018-03-16 DIAGNOSIS — R1031 Right lower quadrant pain: Secondary | ICD-10-CM | POA: Insufficient documentation

## 2018-03-16 DIAGNOSIS — J45909 Unspecified asthma, uncomplicated: Secondary | ICD-10-CM | POA: Insufficient documentation

## 2018-03-16 LAB — CBC
HEMATOCRIT: 41.8 % (ref 36.0–46.0)
HEMOGLOBIN: 14.3 g/dL (ref 12.0–15.0)
MCH: 29.4 pg (ref 26.0–34.0)
MCHC: 34.2 g/dL (ref 30.0–36.0)
MCV: 85.8 fL (ref 78.0–100.0)
Platelets: 377 10*3/uL (ref 150–400)
RBC: 4.87 MIL/uL (ref 3.87–5.11)
RDW: 13.5 % (ref 11.5–15.5)
WBC: 8.5 10*3/uL (ref 4.0–10.5)

## 2018-03-16 LAB — URINALYSIS, ROUTINE W REFLEX MICROSCOPIC
Bilirubin Urine: NEGATIVE
Glucose, UA: NEGATIVE mg/dL
Ketones, ur: 80 mg/dL — AB
Leukocytes, UA: NEGATIVE
NITRITE: NEGATIVE
PROTEIN: NEGATIVE mg/dL
Specific Gravity, Urine: 1.015 (ref 1.005–1.030)
pH: 7 (ref 5.0–8.0)

## 2018-03-16 LAB — I-STAT BETA HCG BLOOD, ED (MC, WL, AP ONLY)

## 2018-03-16 LAB — COMPREHENSIVE METABOLIC PANEL
ALT: 12 U/L — ABNORMAL LOW (ref 14–54)
ANION GAP: 16 — AB (ref 5–15)
AST: 17 U/L (ref 15–41)
Albumin: 4.8 g/dL (ref 3.5–5.0)
Alkaline Phosphatase: 59 U/L (ref 38–126)
BUN: 10 mg/dL (ref 6–20)
CHLORIDE: 106 mmol/L (ref 101–111)
CO2: 18 mmol/L — AB (ref 22–32)
Calcium: 9.7 mg/dL (ref 8.9–10.3)
Creatinine, Ser: 0.82 mg/dL (ref 0.44–1.00)
GFR calc Af Amer: >60 mL/min (ref >60)
GFR calc non Af Amer: >60 mL/min (ref >60)
GLUCOSE: 92 mg/dL (ref 65–99)
POTASSIUM: 3.6 mmol/L (ref 3.5–5.1)
Sodium: 140 mmol/L (ref 135–145)
TOTAL PROTEIN: 8.3 g/dL — AB (ref 6.5–8.1)
Total Bilirubin: 1 mg/dL (ref 0.3–1.2)

## 2018-03-16 LAB — LIPASE, BLOOD: Lipase: 30 U/L (ref 11–51)

## 2018-03-16 MED ORDER — PROMETHAZINE HCL 25 MG/ML IJ SOLN
12.5000 mg | Freq: Once | INTRAMUSCULAR | Status: AC
  Administered 2018-03-16: 12.5 mg via INTRAVENOUS
  Filled 2018-03-16: qty 1

## 2018-03-16 MED ORDER — GI COCKTAIL ~~LOC~~
30.0000 mL | Freq: Once | ORAL | Status: AC
  Administered 2018-03-16: 30 mL via ORAL
  Filled 2018-03-16: qty 30

## 2018-03-16 MED ORDER — LORAZEPAM 2 MG/ML IJ SOLN
0.5000 mg | Freq: Once | INTRAMUSCULAR | Status: AC
  Administered 2018-03-16: 0.5 mg via INTRAVENOUS
  Filled 2018-03-16: qty 1

## 2018-03-16 MED ORDER — PANTOPRAZOLE SODIUM 20 MG PO TBEC: 20.0000 mg | DELAYED_RELEASE_TABLET | Freq: Every day | ORAL | 0 refills | Status: DC

## 2018-03-16 MED ORDER — SODIUM CHLORIDE 0.9 % IV BOLUS
2000.0000 mL | Freq: Once | INTRAVENOUS | Status: AC
  Administered 2018-03-16: 2000 mL via INTRAVENOUS

## 2018-03-16 MED ORDER — PROMETHAZINE HCL 25 MG RE SUPP: 25.0000 mg | Freq: Four times a day (QID) | RECTAL | 0 refills | Status: DC | PRN

## 2018-03-16 MED ORDER — METOCLOPRAMIDE HCL 5 MG/ML IJ SOLN
10.0000 mg | Freq: Once | INTRAMUSCULAR | Status: AC
  Administered 2018-03-16: 10 mg via INTRAVENOUS
  Filled 2018-03-16: qty 2

## 2018-03-16 MED ORDER — ONDANSETRON 4 MG PO TBDP
4.0000 mg | ORAL_TABLET | Freq: Once | ORAL | Status: AC | PRN
  Administered 2018-03-16: 4 mg via ORAL
  Filled 2018-03-16: qty 1

## 2018-03-16 MED ORDER — SUCRALFATE 1 G PO TABS: 1.0000 g | ORAL_TABLET | Freq: Three times a day (TID) | ORAL | 0 refills | Status: DC

## 2018-03-16 NOTE — ED Provider Notes (Signed)
[facility]-EMERGENCY DEPT Provider Note   CSN: [id number] Arrival date & time: 03/16/18  1027     History   Chief Complaint Chief Complaint  Patient presents with  . Emesis    HPI Heather Joyce is a 27 y.o. female.  HPI Patient with history of recurrent nausea and vomiting.  States latest episode started 2 weeks ago.  She was seen in the urgent care 2 days ago.  States her symptoms have not improved.  States she has multiple episodes of vomiting daily.  Not tolerating oral intake.  States she is having burning pain that radiates up to her chest denies taking NSAIDs, excessive alcohol or illegal drugs.  Patient says she has had normal bowel movements.  No blood in the vomit or stool.  No fever or chills.  She has not seen a gastroenterologist.  She is actively on her menstrual cycle. Past Medical History:  Diagnosis Date  . Asthma   . 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 SVD (spontaneous vaginal delivery) 07/05/2017  . Shortness of breath   . [redacted] weeks gestation of pregnancy   . Gastroesophageal reflux disease   . Chest pain 01/02/2017  . Hyponatremia 01/02/2017  . Hypokalemia 01/02/2017  . Pregnancy 01/02/2017  . Leukocytosis 01/02/2017    Past Surgical History:  Procedure Laterality Date  . DENTAL SURGERY    . NO PAST SURGERIES       OB History    Gravida  2   Para  1   Term  1   Preterm      AB  1   Living  1     SAB  1   TAB      Ectopic      Multiple  0   Live Births  1            Home Medications    Prior to Admission medications   Medication Sig Start Date End Date Taking? Authorizing Provider  famotidine (PEPCID) 20 MG tablet Take 1 tablet (20 mg total) by mouth daily. Patient not taking: Reported on 03/16/2018 01/05/17   Edsel Petrin, DO  ibuprofen (ADVIL,MOTRIN) 600 MG tablet Take 1 tablet (600 mg total) by mouth every 6 (six) hours as needed for moderate pain or  cramping. Patient not taking: Reported on 03/16/2018 07/07/17   Key, Jearld Lesch, DO  meclizine (ANTIVERT) 25 MG tablet Take 1 tablet (25 mg total) by mouth 2 (two) times daily as needed for nausea. Patient not taking: Reported on 03/16/2018 01/04/17   Edsel Petrin, DO  ondansetron (ZOFRAN-ODT) 4 MG disintegrating tablet Take 1 tablet (4 mg total) by mouth every 8 (eight) hours as needed for nausea or vomiting. Patient not taking: Reported on 03/16/2018 03/14/18   Linus Mako B, NP  pantoprazole (PROTONIX) 20 MG tablet Take 1 tablet (20 mg total) by mouth daily. 03/16/18   Loren Racer, MD  Prenatal Vit-Fe Fumarate-FA (PRENATAL MULTIVITAMIN) TABS tablet Take 1 tablet by mouth daily at 12 noon. Patient not taking: Reported on 03/16/2018 01/05/17   Edsel Petrin, DO  promethazine (PHENERGAN) 25 MG suppository Place 1 suppository (25 mg total) rectally every 6 (six) hours as needed for nausea or vomiting. 03/16/18   Loren Racer, MD  sucralfate (CARAFATE) 1 g tablet Take 1 tablet (1 g total) by mouth 4 (four) times daily -  with meals and at bedtime. 03/16/18   Loren Racer,  MD    Family History Family History  Problem Relation Age of Onset  . Hypertension Father   . Hypertension Paternal Grandfather   . Cancer Neg Hx     Social History Social History   Tobacco Use  . Smoking status: Never Smoker  . Smokeless tobacco: Never Used  Substance Use Topics  . Alcohol use: No  . Drug use: No     Allergies   Penicillins; Pineapple; and Food   Review of Systems Review of Systems  Constitutional: Negative for chills and fever.  HENT: Positive for sore throat. Negative for trouble swallowing.   Respiratory: Negative for cough and shortness of breath.   Cardiovascular: Positive for chest pain.  Gastrointestinal: Positive for nausea and vomiting. Negative for abdominal pain, constipation and diarrhea.  Genitourinary: Positive for vaginal bleeding. Negative for dysuria, flank pain,  frequency and pelvic pain.  Musculoskeletal: Negative for back pain, myalgias and neck pain.  Skin: Negative for rash and wound.  Neurological: Negative for dizziness, weakness, light-headedness, numbness and headaches.  Psychiatric/Behavioral: Positive for sleep disturbance.  All other systems reviewed and are negative.    Physical Exam Updated Vital Signs BP (!) 138/98   Pulse 69   Temp 98.5 F (36.9 C) (Oral)   Resp 10   LMP 03/14/2018   SpO2 98%   Physical Exam  Constitutional: She is oriented to person, place, and time. She appears well-developed and well-nourished. She appears distressed.  HENT:  Head: Normocephalic and atraumatic.  Mouth/Throat: Oropharynx is clear and moist.  Oropharynx is erythematous but no tonsillar hypertrophy, exudates.  Uvula is midline.  Eyes: Pupils are equal, round, and reactive to light. EOM are normal.  Neck: Normal range of motion. Neck supple.  Cardiovascular: Normal rate and regular rhythm. Exam reveals no gallop and no friction rub.  No murmur heard. Pulmonary/Chest: Effort normal and breath sounds normal. No stridor. No respiratory distress. She has no wheezes. She has no rales.  Abdominal: Soft. Bowel sounds are normal. There is tenderness. There is no rebound and no guarding.  Mild suprapubic and right lower quadrant tenderness to palpation.  No rebound or guarding.  Musculoskeletal: Normal range of motion. She exhibits no edema or tenderness.  No midline thoracic or lumbar tenderness.  No CVA tenderness.  No lower extremity swelling, asymmetry or tenderness.  Lymphadenopathy:    She has no cervical adenopathy.  Neurological: She is alert and oriented to person, place, and time.  Moves all extremities without focal deficit.  Sensation fully intact.  Skin: Skin is warm and dry. Capillary refill takes less than 2 seconds. No rash noted. No erythema.  Psychiatric: She has a normal mood and affect. Her behavior is normal.  Nursing note  and vitals reviewed.    ED Treatments / Results  Labs (all labs ordered are listed, but only abnormal results are displayed) Labs Reviewed  COMPREHENSIVE METABOLIC PANEL - Abnormal; Notable for the following components:      Result Value   CO2 18 (*)    Total Protein 8.3 (*)    ALT 12 (*)    Anion gap 16 (*)    All other components within normal limits  URINALYSIS, ROUTINE W REFLEX MICROSCOPIC - Abnormal; Notable for the following components:   Hgb urine dipstick MODERATE (*)    Ketones, ur 80 (*)    Bacteria, UA RARE (*)    All other components within normal limits  LIPASE, BLOOD  CBC  I-STAT BETA HCG BLOOD, ED ([facility],  [facility], [facility] ONLY)    EKG EKG Interpretation  Date/Time:  Saturday Mar 16 2018 12:37:06 EDT Ventricular Rate:  66 PR Interval:    QRS Duration: 91 QT Interval:  418 QTC Calculation: 438 R Axis:   96 Text Interpretation:  Sinus arrhythmia Borderline prolonged PR interval Borderline right axis deviation Confirmed by Loren Racer ([id number]) on 03/16/2018 3:37:48 PM   Radiology Dg Abd Acute W/chest  Result Date: 03/16/2018 CLINICAL DATA:  Nausea and vomiting 3 weeks with abdominal pain. Negative pregnancy test. EXAM: DG ABDOMEN ACUTE W/ 1V CHEST COMPARISON:  Chest x-ray 01/02/2017 FINDINGS: There is no evidence of dilated bowel loops or free intraperitoneal air. No radiopaque calculi or other significant radiographic abnormality is seen. Heart size and mediastinal contours are within normal limits. Both lungs are clear. IMPRESSION: Negative abdominal radiographs.  No acute cardiopulmonary disease. Electronically Signed   By: Elberta Fortis M.D.   On: 03/16/2018 13:17    Procedures Procedures (including critical care time)  Medications Ordered in ED Medications  gi cocktail (Maalox,Lidocaine,Donnatal) (has no administration in time range)  ondansetron (ZOFRAN-ODT) disintegrating tablet 4 mg (4 mg Oral Given 03/16/18 1040)  metoCLOPramide (REGLAN) injection 10 mg (10  mg Intravenous Given 03/16/18 1231)  LORazepam (ATIVAN) injection 0.5 mg (0.5 mg Intravenous Given 03/16/18 1232)  sodium chloride 0.9 % bolus 2,000 mL (0 mLs Intravenous Stopped 03/16/18 1429)  LORazepam (ATIVAN) injection 0.5 mg (0.5 mg Intravenous Given 03/16/18 1514)  promethazine (PHENERGAN) injection 12.5 mg (12.5 mg Intravenous Given 03/16/18 1514)     Initial Impression / Assessment and Plan / ED Course  I have reviewed the triage vital signs and the nursing notes.  Pertinent labs & imaging results that were available during my care of the patient were reviewed by me and considered in my medical decision making (see chart for details).     No further vomiting in the emergency department.  Vital signs stable.  Symptoms have improved with antiemetic and IV fluids.  Will start on PPI.  Encouraged to follow-up closely with gastroenterology.  Return precautions have been given.  Final Clinical Impressions(s) / ED Diagnoses   Final diagnoses:  Non-intractable vomiting with nausea, unspecified vomiting type  Gastroesophageal reflux disease with esophagitis    ED Discharge Orders        Ordered    pantoprazole (PROTONIX) 20 MG tablet  Daily     03/16/18 1536    promethazine (PHENERGAN) 25 MG suppository  Every 6 hours PRN     03/16/18 1536    sucralfate (CARAFATE) 1 g tablet  3 times daily with meals & bedtime     03/16/18 1536       Loren Racer, MD 03/16/18 1539

## 2018-03-16 NOTE — ED Notes (Signed)
Bed: WA07 Expected date:  Expected time:  Means of arrival:  Comments: 

## 2018-03-16 NOTE — ED Triage Notes (Signed)
She states she has had frequent n/v x 3 weeks. [facility] no real relief of symptoms. She is actively retching at triage. She states she "can't sleep and I don't have a normal life--they keep giving me things, but nothing helps".

## 2018-03-19 ENCOUNTER — Encounter: Payer: Self-pay | Admitting: Gastroenterology

## 2018-03-23 ENCOUNTER — Other Ambulatory Visit: Payer: Self-pay

## 2018-03-23 ENCOUNTER — Encounter (HOSPITAL_COMMUNITY): Payer: Self-pay

## 2018-03-23 ENCOUNTER — Emergency Department (HOSPITAL_COMMUNITY): Payer: Self-pay

## 2018-03-23 ENCOUNTER — Emergency Department (HOSPITAL_COMMUNITY)
Admission: EM | Admit: 2018-03-23 | Discharge: 2018-03-23 | Disposition: A | Discharge status: Home / Self Care | Payer: Self-pay | Attending: Emergency Medicine | Admitting: Emergency Medicine

## 2018-03-23 DIAGNOSIS — E876 Hypokalemia: Secondary | ICD-10-CM

## 2018-03-23 DIAGNOSIS — J45909 Unspecified asthma, uncomplicated: Secondary | ICD-10-CM | POA: Insufficient documentation

## 2018-03-23 DIAGNOSIS — R112 Nausea with vomiting, unspecified: Secondary | ICD-10-CM | POA: Insufficient documentation

## 2018-03-23 DIAGNOSIS — R109 Unspecified abdominal pain: Secondary | ICD-10-CM

## 2018-03-23 DIAGNOSIS — R1033 Periumbilical pain: Secondary | ICD-10-CM | POA: Insufficient documentation

## 2018-03-23 LAB — COMPREHENSIVE METABOLIC PANEL
ALK PHOS: 54 U/L (ref 38–126)
ALT: 14 U/L (ref 14–54)
ANION GAP: 15 (ref 5–15)
AST: 17 U/L (ref 15–41)
Albumin: 4.9 g/dL (ref 3.5–5.0)
BUN: 9 mg/dL (ref 6–20)
CALCIUM: 9.5 mg/dL (ref 8.9–10.3)
CO2: 18 mmol/L — ABNORMAL LOW (ref 22–32)
Chloride: 105 mmol/L (ref 101–111)
Creatinine, Ser: 0.8 mg/dL (ref 0.44–1.00)
GFR calc non Af Amer: >60 mL/min (ref >60)
Glucose, Bld: 99 mg/dL (ref 65–99)
POTASSIUM: 3 mmol/L — AB (ref 3.5–5.1)
Sodium: 138 mmol/L (ref 135–145)
Total Bilirubin: 0.8 mg/dL (ref 0.3–1.2)
Total Protein: 8.1 g/dL (ref 6.5–8.1)

## 2018-03-23 LAB — CBC WITH DIFFERENTIAL/PLATELET
Basophils Absolute: 0 10*3/uL (ref 0.0–0.1)
Basophils Relative: 0 %
EOS ABS: 0 10*3/uL (ref 0.0–0.7)
Eosinophils Relative: 0 %
HEMATOCRIT: 39.8 % (ref 36.0–46.0)
HEMOGLOBIN: 14 g/dL (ref 12.0–15.0)
Lymphocytes Relative: 8 %
Lymphs Abs: 0.8 10*3/uL (ref 0.7–4.0)
MCH: 29.5 pg (ref 26.0–34.0)
MCHC: 35.2 g/dL (ref 30.0–36.0)
MCV: 84 fL (ref 78.0–100.0)
MONO ABS: 0.4 10*3/uL (ref 0.1–1.0)
MONOS PCT: 4 %
NEUTROS ABS: 8.3 10*3/uL — AB (ref 1.7–7.7)
NEUTROS PCT: 88 %
Platelets: 343 10*3/uL (ref 150–400)
RBC: 4.74 MIL/uL (ref 3.87–5.11)
RDW: 13.5 % (ref 11.5–15.5)
WBC: 9.5 10*3/uL (ref 4.0–10.5)

## 2018-03-23 LAB — I-STAT BETA HCG BLOOD, ED (MC, WL, AP ONLY): I-stat hCG, quantitative: <5 m[IU]/mL (ref <5)

## 2018-03-23 LAB — URINALYSIS, ROUTINE W REFLEX MICROSCOPIC
Glucose, UA: NEGATIVE mg/dL
HGB URINE DIPSTICK: NEGATIVE
KETONES UR: 80 mg/dL — AB
LEUKOCYTES UA: NEGATIVE
NITRITE: NEGATIVE
Protein, ur: 30 mg/dL — AB
SPECIFIC GRAVITY, URINE: 1.028 (ref 1.005–1.030)
pH: 6 (ref 5.0–8.0)

## 2018-03-23 LAB — RAPID URINE DRUG SCREEN, HOSP PERFORMED
AMPHETAMINES: NOT DETECTED
Barbiturates: POSITIVE — AB
Benzodiazepines: NOT DETECTED
Cocaine: NOT DETECTED
Opiates: NOT DETECTED
Tetrahydrocannabinol: POSITIVE — AB

## 2018-03-23 LAB — LIPASE, BLOOD: LIPASE: 37 U/L (ref 11–51)

## 2018-03-23 MED ORDER — SODIUM CHLORIDE 0.9 % IV BOLUS
1000.0000 mL | Freq: Once | INTRAVENOUS | Status: AC
  Administered 2018-03-23: 1000 mL via INTRAVENOUS

## 2018-03-23 MED ORDER — METOCLOPRAMIDE HCL 5 MG/ML IJ SOLN
10.0000 mg | Freq: Once | INTRAMUSCULAR | Status: AC
  Administered 2018-03-23: 10 mg via INTRAVENOUS
  Filled 2018-03-23: qty 2

## 2018-03-23 MED ORDER — PROMETHAZINE HCL 25 MG/ML IJ SOLN
12.5000 mg | Freq: Once | INTRAMUSCULAR | Status: AC
  Administered 2018-03-23: 12.5 mg via INTRAVENOUS
  Filled 2018-03-23: qty 1

## 2018-03-23 MED ORDER — ONDANSETRON 4 MG PO TBDP
4.0000 mg | ORAL_TABLET | Freq: Once | ORAL | Status: AC | PRN
  Administered 2018-03-23: 4 mg via ORAL
  Filled 2018-03-23: qty 1

## 2018-03-23 MED ORDER — IOPAMIDOL (ISOVUE-300) INJECTION 61%
INTRAVENOUS | Status: AC
  Administered 2018-03-23: 12:00:00
  Filled 2018-03-23: qty 100

## 2018-03-23 MED ORDER — IOPAMIDOL (ISOVUE-300) INJECTION 61%
100.0000 mL | Freq: Once | INTRAVENOUS | Status: AC | PRN
  Administered 2018-03-23: 100 mL via INTRAVENOUS

## 2018-03-23 MED ORDER — GI COCKTAIL ~~LOC~~
30.0000 mL | Freq: Once | ORAL | Status: AC
  Administered 2018-03-23: 30 mL via ORAL
  Filled 2018-03-23: qty 30

## 2018-03-23 MED ORDER — METOCLOPRAMIDE HCL 10 MG PO TABS: 10.0000 mg | ORAL_TABLET | Freq: Four times a day (QID) | ORAL | 0 refills | Status: DC

## 2018-03-23 MED ORDER — POTASSIUM CHLORIDE CRYS ER 20 MEQ PO TBCR
40.0000 meq | EXTENDED_RELEASE_TABLET | Freq: Once | ORAL | Status: AC
  Administered 2018-03-23: 40 meq via ORAL
  Filled 2018-03-23: qty 2

## 2018-03-23 MED ORDER — SUCRALFATE 1 G PO TABS
1.0000 g | ORAL_TABLET | Freq: Once | ORAL | Status: AC
  Administered 2018-03-23: 1 g via ORAL
  Filled 2018-03-23: qty 1

## 2018-03-23 NOTE — ED Provider Notes (Signed)
[facility]-EMERGENCY DEPT Provider Note   CSN: [id number] Arrival date & time: 03/23/18  8469     History   Chief Complaint Chief Complaint  Patient presents with  . Emesis    HPI Heather Joyce is a 27 y.o. female.  HPI Heather Joyce is a 26 y.o. female with history of asthma, presents to emergency department complaining of nausea, vomiting, abdominal pain.  Patient states that she has had persistent nausea, vomiting, abdominal pain for the last month.  She states her symptoms every day.  She is unable to eat or drink anything.  She states eating does make her symptoms worse.  She reports some blood that she has seen her emesis few days ago.  She is currently vomiting yellow clear liquid.  States she has not had a bowel movement in 1 week.  She has been to several urgent cares and been in emergency department for the same.  Had x-ray done which did not show anything.  She has had multiple different antiemetic medications at home with no relief.  She does not smoke marijuana.  She is not pregnant.  She states that she is having abdominal pain but is in the periumbilical area.  No fever or chills.  States "I feel like when I eat I do not digest food and just sits there and to left throat up."  No urinary symptoms.  No history of the same and has never seen a gastroenterologist.  She was given referral to one and she made an appointment but is not until July, 2 months from now.  Past Medical History:  Diagnosis Date  . Asthma   . 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 SVD (spontaneous vaginal delivery) 07/05/2017  . Shortness of breath   . [redacted] weeks gestation of pregnancy   . Gastroesophageal reflux disease   . Chest pain 01/02/2017  . Hyponatremia 01/02/2017  . Hypokalemia 01/02/2017  . Pregnancy 01/02/2017  . Leukocytosis 01/02/2017    Past Surgical History:  Procedure Laterality Date  . DENTAL SURGERY    . NO PAST  SURGERIES       OB History    Gravida  2   Para  1   Term  1   Preterm      AB  1   Living  1     SAB  1   TAB      Ectopic      Multiple  0   Live Births  1            Home Medications    Prior to Admission medications   Medication Sig Start Date End Date Taking? Authorizing Provider  diphenhydrAMINE (BENADRYL) 25 mg capsule Take 50 mg by mouth daily as needed for allergies.   Yes [provider]  famotidine (PEPCID) 20 MG tablet Take 1 tablet (20 mg total) by mouth daily. Patient not taking: Reported on 03/23/2018 01/05/17   Edsel Petrin, DO  ibuprofen (ADVIL,MOTRIN) 600 MG tablet Take 1 tablet (600 mg total) by mouth every 6 (six) hours as needed for moderate pain or cramping. Patient not taking: Reported on 03/23/2018 07/07/17   Key, Jearld Lesch, DO  meclizine (ANTIVERT) 25 MG tablet Take 1 tablet (25 mg total) by mouth 2 (two) times daily as needed for nausea. Patient not taking: Reported on 03/23/2018 01/04/17   Edsel Petrin, DO  ondansetron (ZOFRAN-ODT) 4 MG  disintegrating tablet Take 1 tablet (4 mg total) by mouth every 8 (eight) hours as needed for nausea or vomiting. Patient not taking: Reported on 03/23/2018 03/14/18   Linus Mako B, NP  pantoprazole (PROTONIX) 20 MG tablet Take 1 tablet (20 mg total) by mouth daily. Patient not taking: Reported on 03/23/2018 03/16/18   Loren Racer, MD  Prenatal Vit-Fe Fumarate-FA (PRENATAL MULTIVITAMIN) TABS tablet Take 1 tablet by mouth daily at 12 noon. Patient not taking: Reported on 03/23/2018 01/05/17   Edsel Petrin, DO  promethazine (PHENERGAN) 25 MG suppository Place 1 suppository (25 mg total) rectally every 6 (six) hours as needed for nausea or vomiting. Patient not taking: Reported on 03/23/2018 03/16/18   Loren Racer, MD  sucralfate (CARAFATE) 1 g tablet Take 1 tablet (1 g total) by mouth 4 (four) times daily -  with meals and at bedtime. 03/16/18   Loren Racer, MD    Family History Family  History  Problem Relation Age of Onset  . Hypertension Father   . Hypertension Paternal Grandfather   . Cancer Neg Hx     Social History Social History   Tobacco Use  . Smoking status: Never Smoker  . Smokeless tobacco: Never Used  Substance Use Topics  . Alcohol use: No  . Drug use: No     Allergies   Penicillins; Pineapple; and Food   Review of Systems Review of Systems  Constitutional: Negative for chills and fever.  Respiratory: Negative for cough, chest tightness and shortness of breath.   Cardiovascular: Negative for chest pain, palpitations and leg swelling.  Gastrointestinal: Positive for abdominal pain, nausea and vomiting. Negative for diarrhea.  Genitourinary: Negative for dysuria, flank pain, pelvic pain, vaginal bleeding, vaginal discharge and vaginal pain.  Musculoskeletal: Negative for arthralgias, myalgias, neck pain and neck stiffness.  Skin: Negative for rash.  Neurological: Negative for dizziness, weakness and headaches.  All other systems reviewed and are negative.    Physical Exam Updated Vital Signs BP (!) 134/99 (BP Location: Left Arm)   Pulse 72   Temp (!) 97.4 F (36.3 C) (Oral)   Resp 18   LMP 03/14/2018   SpO2 100%   Physical Exam  Constitutional: She appears well-developed and well-nourished. No distress.  HENT:  Head: Normocephalic.  Eyes: Conjunctivae are normal.  Neck: Neck supple.  Cardiovascular: Normal rate, regular rhythm and normal heart sounds.  Pulmonary/Chest: Effort normal and breath sounds normal. No respiratory distress. She has no wheezes. She has no rales.  Abdominal: Soft. Bowel sounds are normal. She exhibits no distension. There is tenderness. There is no rebound.  Diffuse tenderness, worse over periumbilical area  Musculoskeletal: She exhibits no edema.  Neurological: She is alert.  Skin: Skin is warm and dry.  Psychiatric: She has a normal mood and affect. Her behavior is normal.  Nursing note and vitals  reviewed.    ED Treatments / Results  Labs (all labs ordered are listed, but only abnormal results are displayed) Labs Reviewed  CBC WITH DIFFERENTIAL/PLATELET - Abnormal; Notable for the following components:      Result Value   Neutro Abs 8.3 (*)    All other components within normal limits  COMPREHENSIVE METABOLIC PANEL - Abnormal; Notable for the following components:   Potassium 3.0 (*)    CO2 18 (*)    All other components within normal limits  RAPID URINE DRUG SCREEN, HOSP PERFORMED - Abnormal; Notable for the following components:   Tetrahydrocannabinol POSITIVE (*)    Barbiturates POSITIVE (*)  All other components within normal limits  URINALYSIS, ROUTINE W REFLEX MICROSCOPIC - Abnormal; Notable for the following components:   APPearance CLOUDY (*)    Bilirubin Urine SMALL (*)    Ketones, ur 80 (*)    Protein, ur 30 (*)    Bacteria, UA RARE (*)    All other components within normal limits  LIPASE, BLOOD  I-STAT BETA HCG BLOOD, ED ([facility], [facility], [facility] ONLY)    EKG None  Radiology Ct Abdomen Pelvis W Contrast  Result Date: 03/23/2018 CLINICAL DATA:  Nausea vomiting and acute diffuse abdominal pain. EXAM: CT ABDOMEN AND PELVIS WITH CONTRAST TECHNIQUE: Multidetector CT imaging of the abdomen and pelvis was performed using the standard protocol following bolus administration of intravenous contrast. CONTRAST:  ISOVUE-300 IOPAMIDOL (ISOVUE-300) INJECTION 61% COMPARISON:  Chest and abdomen radiographs dated 03/16/2018. FINDINGS: Lower chest: Clear lung bases. Hepatobiliary: No focal liver abnormality is seen. No gallstones, gallbladder wall thickening, or biliary dilatation. Pancreas: Unremarkable. No pancreatic ductal dilatation or surrounding inflammatory changes. Spleen: Normal in size without focal abnormality. Adrenals/Urinary Tract: Normal appearing adrenal glands. Tiny lower pole right renal cyst. Tiny mid left renal calculus. Unremarkable urinary bladder and ureters.  Stomach/Bowel: Poorly distended colon with mild diffuse low density wall thickening involving the upper right colon, patent flexure, transverse colon, splenic flexure and descending colon. No significant pericolonic soft tissue stranding seen. Unremarkable stomach and small bowel. Normal appearing appendix. Vascular/Lymphatic: No significant vascular findings are present. No enlarged abdominal or pelvic lymph nodes. Reproductive: Uterus and bilateral adnexa are unremarkable. Other: No abdominal wall hernia or abnormality. No abdominopelvic ascites. Musculoskeletal: Mild to moderate levoconvex lumbar scoliosis. Otherwise, normal appearing bones. IMPRESSION: 1. Mild diffuse low density wall thickening involving the right, transverse and descending colon. This could be due to colitis or gastroenteritis. 2. Tiny, nonobstructing left renal calculus. Electronically Signed   By: Beckie Salts M.D.   On: 03/23/2018 11:42    Procedures Procedures (including critical care time)  Medications Ordered in ED Medications  sodium chloride 0.9 % bolus 1,000 mL (has no administration in time range)  promethazine (PHENERGAN) injection 12.5 mg (has no administration in time range)  gi cocktail (Maalox,Lidocaine,Donnatal) (has no administration in time range)  ondansetron (ZOFRAN-ODT) disintegrating tablet 4 mg (4 mg Oral Given 03/23/18 0942)     Initial Impression / Assessment and Plan / ED Course  I have reviewed the triage vital signs and the nursing notes.  Pertinent labs & imaging results that were available during my care of the patient were reviewed by me and considered in my medical decision making (see chart for details).     Patient emergency department with persistent nausea, vomiting, abdominal pain.  Her abdomen is soft, diffusely tender.  Her vital signs are all within normal.  She does appear to be pale and she is actively vomiting during my exam.  Will repeat labs, will get CT abdomen and pelvis for  further evaluation, she has not been imaged yet for this.  She states last time the only thing I worked with Phenergan GI cocktail, will try the same.  1:41 PM Labs unremarkable other than hypokalemia, potassium Is 3, will give in ED. CT scan concerning for mild colitis versus gastroenteritis.  Since patient has had symptoms for months, she is afebrile, she does not have a white count, I do not think she is got infectious colitis.  She is not having diarrhea.  In fact she has not had a bowel movement in  a week.  This could be IBD versus cyclical vomiting from marijuana use which she still denies, although her drug screen is positive, could be gastroparesis, could be acid reflux.  She states GI cocktail makes her feel much better.  I encouraged her to take Carafate as she was prescribed before and never filled.  We also discussed antiemetics at home, and close follow-up with GI.  She already got a GI referral to [facility] but does not have an appointment for 2 months, will give her some of the options to try to call.  She is drinking ginger ale in the emergency department, tolerating it well.  Her vital signs are normal.  She is stable for discharge home at this time.  Return precautions discussed.  Vitals:   03/23/18 0916 03/23/18 1151 03/23/18 1200  BP: (!) 134/99 (!) 136/91 (!) 162/88  Pulse: 72 74 64  Resp: 18 10 (!) 9  Temp: (!) 97.4 F (36.3 C)    TempSrc: Oral    SpO2: 100% 100% 100%     Final Clinical Impressions(s) / ED Diagnoses   Final diagnoses:  Intractable vomiting with nausea, unspecified vomiting type  Abdominal pain, unspecified abdominal location  Hypokalemia    ED Discharge Orders        Ordered    metoCLOPramide (REGLAN) 10 MG tablet  Every 6 hours     03/23/18 1344       Jaynie Crumble, PA-C 03/23/18 1346    Doug Sou, MD 03/23/18 1506

## 2018-03-23 NOTE — ED Triage Notes (Signed)
He reports persistent n/v. Was seen here for same about a week ago. She is constantly retching at triage.

## 2018-03-23 NOTE — Discharge Instructions (Signed)
Take maalox as needed. Carafate before each meal and at night time. Avoid spicy or tomato based foods. No alcohol or caffeine. Drink plenty of fluids. Reglan for nausea and vomiting as needed. Follow up with GI.

## 2018-05-29 ENCOUNTER — Encounter: Payer: Self-pay | Admitting: Gastroenterology

## 2018-05-29 ENCOUNTER — Ambulatory Visit (INDEPENDENT_AMBULATORY_CARE_PROVIDER_SITE_OTHER): Payer: Self-pay | Admitting: Gastroenterology

## 2018-05-29 ENCOUNTER — Encounter (INDEPENDENT_AMBULATORY_CARE_PROVIDER_SITE_OTHER): Payer: Self-pay

## 2018-05-29 VITALS — BP 88/58 | HR 70 | Ht 64.5 in | Wt 136.0 lb

## 2018-05-29 DIAGNOSIS — K219 Gastro-esophageal reflux disease without esophagitis: Secondary | ICD-10-CM

## 2018-05-29 DIAGNOSIS — K582 Mixed irritable bowel syndrome: Secondary | ICD-10-CM

## 2018-05-29 MED ORDER — PANTOPRAZOLE SODIUM 40 MG PO TBEC: 40.0000 mg | DELAYED_RELEASE_TABLET | Freq: Every day | ORAL | 11 refills | Status: DC

## 2018-05-29 MED ORDER — DICYCLOMINE HCL 10 MG PO CAPS: 10.0000 mg | ORAL_CAPSULE | Freq: Three times a day (TID) | ORAL | 11 refills | Status: DC | PRN

## 2018-05-29 NOTE — Progress Notes (Addendum)
History of Present Illness: This is a 27 year old female referred by Doug SouSam Jacubowitz, MD for the evaluation of GERD, nausea, vomiting, diarrhea, constipation. She related a few episodes of with streaks of blood in her emesis when her symptoms started. The symptoms began in May and her GERD, nausea and vomiting have essentially resolved on pantoprazole and metoclopramide.  Her alternating diarrhea and constipation persists.  She does not relate any specific dietary stressors for either symptom. Denies weight loss, abdominal pain, change in stool caliber, melena, hematochezia, dysphagia, chest pain.  Abd/pelvic CT 02/2018 IMPRESSION: 1. Mild diffuse low density wall thickening involving the right, transverse and descending colon. This could be due to colitis or gastroenteritis. 2. Tiny, nonobstructing left renal calculus.    Allergies  Allergen Reactions  . Penicillins Anaphylaxis and Other (See Comments)    Has patient had a PCN reaction causing immediate rash, facial/tongue/throat swelling, SOB or lightheadedness with hypotension: Yes Has patient had a PCN reaction causing severe rash involving mucus membranes or skin necrosis: No Has patient had a PCN reaction that required hospitalization: No Has patient had a PCN reaction occurring within the last 10 years: No If all of the above answers are "NO", then may proceed with Cephalosporin use.   Marland Kitchen. Pineapple Anaphylaxis  . Food Rash and Other (See Comments)    Pt is allergic to peanuts   Outpatient Medications Prior to Visit  Medication Sig Dispense Refill  . diphenhydrAMINE (BENADRYL) 25 mg capsule Take 50 mg by mouth daily as needed for allergies.    . famotidine (PEPCID) 20 MG tablet Take 1 tablet (20 mg total) by mouth daily. (Patient taking differently: Take 20 mg by mouth daily as needed. ) 30 tablet 0  . ibuprofen (ADVIL,MOTRIN) 600 MG tablet Take 1 tablet (600 mg total) by mouth every 6 (six) hours as needed for moderate pain or  cramping. 30 tablet 0  . metoCLOPramide (REGLAN) 10 MG tablet Take 1 tablet (10 mg total) by mouth every 6 (six) hours. 30 tablet 0  . ondansetron (ZOFRAN-ODT) 4 MG disintegrating tablet Take 1 tablet (4 mg total) by mouth every 8 (eight) hours as needed for nausea or vomiting. 12 tablet 0  . pantoprazole (PROTONIX) 20 MG tablet Take 1 tablet (20 mg total) by mouth daily. 30 tablet 0  . sucralfate (CARAFATE) 1 g tablet Take 1 tablet (1 g total) by mouth 4 (four) times daily -  with meals and at bedtime. 28 tablet 0  . meclizine (ANTIVERT) 25 MG tablet Take 1 tablet (25 mg total) by mouth 2 (two) times daily as needed for nausea. (Patient not taking: Reported on 03/23/2018) 60 tablet 0  . Prenatal Vit-Fe Fumarate-FA (PRENATAL MULTIVITAMIN) TABS tablet Take 1 tablet by mouth daily at 12 noon. (Patient not taking: Reported on 03/23/2018) 30 tablet 0  . promethazine (PHENERGAN) 25 MG suppository Place 1 suppository (25 mg total) rectally every 6 (six) hours as needed for nausea or vomiting. (Patient not taking: Reported on 03/23/2018) 12 each 0   No facility-administered medications prior to visit.    Past Medical History:  Diagnosis Date  . 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 DENTAL SURGERY  2011   wisdom teeth removed  . NO PAST SURGERIES     Social History   Socioeconomic History  . Marital status: Single    Spouse name: Not on file  . Number of children: Not on file  .  Years of education: Not on file  . Highest education level: Not on file  Occupational History  . Not on file  Social Needs  . Financial resource strain: Not on file  . Food insecurity:    Worry: Not on file    Inability: Not on file  . Transportation needs:    Medical: Not on file    Non-medical: Not on file  Tobacco Use  . Smoking status: Never Smoker  . Smokeless tobacco: Never Used  Substance and Sexual Activity  . Alcohol use: Yes    Comment: ocassionally  . Drug use: No   . Sexual activity: Yes    Birth control/protection: Condom  Lifestyle  . Physical activity:    Days per week: Not on file    Minutes per session: Not on file  . Stress: Not on file  Relationships  . Social connections:    Talks on phone: Not on file    Gets together: Not on file    Attends religious service: Not on file    Active member of club or organization: Not on file    Attends meetings of clubs or organizations: Not on file    Relationship status: Not on file  Other Topics Concern  . Not on file  Social History Narrative  . Not on file   Family History  Problem Relation Age of Onset  . Hypertension Father   . Hypertension Paternal Grandfather   . Esophageal cancer Paternal Grandfather   . Colon cancer Neg Hx        Review of Systems: Pertinent positive and negative review of systems were noted in the above HPI section. All other review of systems were otherwise negative.   Physical Exam: General: Well developed, well nourished, no acute distress Head: Normocephalic and atraumatic Eyes:  sclerae anicteric, EOMI Ears: Normal auditory acuity Mouth: No deformity or lesions Neck: Supple, no masses or thyromegaly Lungs: Clear throughout to auscultation Heart: Regular rate and rhythm; no murmurs, rubs or bruits Abdomen: Soft, non tender and non distended. No masses, hepatosplenomegaly or hernias noted. Normal Bowel sounds Rectal: Not done Musculoskeletal: Symmetrical with no gross deformities  Skin: No lesions on visible extremities Pulses:  Normal pulses noted Extremities: No clubbing, cyanosis, edema or deformities noted Neurological: Alert oriented x 4, grossly nonfocal Cervical Nodes:  No significant cervical adenopathy Inguinal Nodes: No significant inguinal adenopathy Psychological:  Alert and cooperative. Normal mood and affect  Assessment and Recommendations:  1.  GERD with regurgitation, scant hematemesis, nausea and vomiting.  Suspected esophagitis,  gastritis or ulcer.  Offered EGD to further evaluate her symptoms and since her symptoms are now under control she declines.  Increase pantoprazole to 40 mg daily.  Discontinue metoclopramide.  May use famotidine or Tums as needed.  We will  discuss EGD again if her symptoms are not adequately controlled.  ERV in 6-8 weeks.   2. Alternating constipation and diarrhea. Thickened colon on CT scan. Suspected IBS or food intolerances. Trial dicyclomine 10 mg 3 times daily as needed.  CT scan findings are likely an artifact or a self limited process however if these symptoms persist we will need to further evaluate with colonoscopy.  I have asked her to keep a food diary to attempt to determine any dietary stressors.  We talked about milk products, caffeine, high sugar foods, high fat foods that could be dietary stressors. REV in 6-8 weeks.   cc: Doug Sou, MD

## 2018-05-29 NOTE — Patient Instructions (Signed)
Stop taking your metoclopramide.   We have sent the following medications to your pharmacy for you to pick up at your convenience: pantoprazole 40 mg daily and dicyclomine 10 mg three times a day as needed.   Patient advised to avoid spicy, acidic, citrus, chocolate, mints, fruit and fruit juices.  Limit the intake of caffeine, alcohol and Soda.  Don't exercise too soon after eating.  Don't lie down within 3-4 hours of eating.  Elevate the head of your bed.  Thank you for choosing me and [facility].  Venita LickMalcolm T. Pleas KochStark, Jr., MD., Clementeen GrahamFACG

## 2018-07-20 ENCOUNTER — Encounter (HOSPITAL_COMMUNITY): Payer: Self-pay | Admitting: Emergency Medicine

## 2018-07-20 ENCOUNTER — Emergency Department (HOSPITAL_COMMUNITY)
Admission: EM | Admit: 2018-07-20 | Discharge: 2018-07-20 | Disposition: A | Discharge status: Home / Self Care | Payer: Medicaid Other | Attending: Emergency Medicine | Admitting: Emergency Medicine

## 2018-07-20 ENCOUNTER — Other Ambulatory Visit: Payer: Self-pay

## 2018-07-20 DIAGNOSIS — R1013 Epigastric pain: Secondary | ICD-10-CM

## 2018-07-20 DIAGNOSIS — R112 Nausea with vomiting, unspecified: Secondary | ICD-10-CM | POA: Insufficient documentation

## 2018-07-20 DIAGNOSIS — Z79899 Other long term (current) drug therapy: Secondary | ICD-10-CM | POA: Insufficient documentation

## 2018-07-20 LAB — CBC
HEMATOCRIT: 42.3 % (ref 36.0–46.0)
HEMOGLOBIN: 14.2 g/dL (ref 12.0–15.0)
MCH: 29 pg (ref 26.0–34.0)
MCHC: 33.6 g/dL (ref 30.0–36.0)
MCV: 86.3 fL (ref 78.0–100.0)
Platelets: 337 10*3/uL (ref 150–400)
RBC: 4.9 MIL/uL (ref 3.87–5.11)
RDW: 14.2 % (ref 11.5–15.5)
WBC: 8.6 10*3/uL (ref 4.0–10.5)

## 2018-07-20 LAB — URINALYSIS, ROUTINE W REFLEX MICROSCOPIC
BACTERIA UA: NONE SEEN
BILIRUBIN URINE: NEGATIVE
Glucose, UA: NEGATIVE mg/dL
HGB URINE DIPSTICK: NEGATIVE
Ketones, ur: 80 mg/dL — AB
LEUKOCYTES UA: NEGATIVE
NITRITE: NEGATIVE
Protein, ur: >=300 mg/dL — AB
SPECIFIC GRAVITY, URINE: 1.031 — AB (ref 1.005–1.030)
pH: 7 (ref 5.0–8.0)

## 2018-07-20 LAB — I-STAT BETA HCG BLOOD, ED (MC, WL, AP ONLY)

## 2018-07-20 LAB — COMPREHENSIVE METABOLIC PANEL
ALT: 13 U/L (ref 0–44)
ANION GAP: 15 (ref 5–15)
AST: 20 U/L (ref 15–41)
Albumin: 4.8 g/dL (ref 3.5–5.0)
Alkaline Phosphatase: 58 U/L (ref 38–126)
BUN: 10 mg/dL (ref 6–20)
CO2: 18 mmol/L — ABNORMAL LOW (ref 22–32)
Calcium: 9.9 mg/dL (ref 8.9–10.3)
Chloride: 106 mmol/L (ref 98–111)
Creatinine, Ser: 0.8 mg/dL (ref 0.44–1.00)
Glucose, Bld: 128 mg/dL — ABNORMAL HIGH (ref 70–99)
POTASSIUM: 3.3 mmol/L — AB (ref 3.5–5.1)
Sodium: 139 mmol/L (ref 135–145)
TOTAL PROTEIN: 8.2 g/dL — AB (ref 6.5–8.1)
Total Bilirubin: 1 mg/dL (ref 0.3–1.2)

## 2018-07-20 LAB — LIPASE, BLOOD: Lipase: 29 U/L (ref 11–51)

## 2018-07-20 MED ORDER — ONDANSETRON 4 MG PO TBDP: 4.0000 mg | ORAL_TABLET | Freq: Three times a day (TID) | ORAL | 0 refills | Status: DC | PRN

## 2018-07-20 MED ORDER — PROMETHAZINE HCL 25 MG RE SUPP: 25.0000 mg | Freq: Four times a day (QID) | RECTAL | 0 refills | Status: DC | PRN

## 2018-07-20 MED ORDER — PROMETHAZINE HCL 25 MG/ML IJ SOLN
25.0000 mg | Freq: Once | INTRAMUSCULAR | Status: AC
Start: 2018-07-20 — End: 2018-07-20
  Administered 2018-07-20: 25 mg via INTRAVENOUS
  Filled 2018-07-20: qty 1

## 2018-07-20 MED ORDER — SODIUM CHLORIDE 0.9 % IV BOLUS
1000.0000 mL | Freq: Once | INTRAVENOUS | Status: AC
  Administered 2018-07-20: 1000 mL via INTRAVENOUS

## 2018-07-20 MED ORDER — PANTOPRAZOLE SODIUM 40 MG PO TBEC: 40.0000 mg | DELAYED_RELEASE_TABLET | Freq: Every day | ORAL | 1 refills | Status: DC

## 2018-07-20 MED ORDER — GI COCKTAIL ~~LOC~~
30.0000 mL | Freq: Once | ORAL | Status: AC
  Administered 2018-07-20: 30 mL via ORAL
  Filled 2018-07-20: qty 30

## 2018-07-20 MED ORDER — ONDANSETRON 4 MG PO TBDP
4.0000 mg | ORAL_TABLET | Freq: Once | ORAL | Status: AC
  Administered 2018-07-20: 4 mg via ORAL
  Filled 2018-07-20: qty 1

## 2018-07-20 MED ORDER — ONDANSETRON HCL 4 MG/2ML IJ SOLN
4.0000 mg | Freq: Once | INTRAMUSCULAR | Status: AC
  Administered 2018-07-20: 4 mg via INTRAVENOUS
  Filled 2018-07-20: qty 2

## 2018-07-20 MED ORDER — POTASSIUM CHLORIDE CRYS ER 20 MEQ PO TBCR
20.0000 meq | EXTENDED_RELEASE_TABLET | Freq: Once | ORAL | Status: AC
  Administered 2018-07-20: 20 meq via ORAL
  Filled 2018-07-20: qty 1

## 2018-07-20 NOTE — ED Provider Notes (Signed)
[facility] EMERGENCY DEPARTMENT Provider Note   CSN: [id number] Arrival date & time: 07/20/18  1128     History   Chief Complaint Chief Complaint  Patient presents with  . Emesis  . Nausea  . Abdominal Pain    HPI Heather Joyce is a 27 y.o. female.  HPI   Heather Joyce is a 27 year old female with a history of GERD with recurrent nausea/vomiting, marijuana use who presents to the emergency department for evaluation of intractable vomiting.  Patient reports that her symptoms started 3 days ago.  She states that she has been vomiting every hour and cannot keep any food or liquid down.  States that she has burning epigastric pain which is 9/10 in severity and constant, worsened with vomiting.  States that her symptoms feel similar to prior reflux exacerbations.  No alleviating factors.  She ran out of her Protonix and dicyclomine about a month ago.  She denies fever, chills, dysuria, hematemesis, coffee ground emesis, urinary frequency, vaginal discharge, pelvic pain, constipation, chest pain, shortness of breath, lightheadedness or syncope. Last bowel movement was today and Joyce, no blood or melena. She is seen at [facility] by Dr. Russella Dar. Is requesting gi cocktail as this typically helps her symptoms. She denies sick contacts with similar symptoms. No prior abdominal surgeries.   Past Medical History:  Diagnosis Date  . 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 SVD (spontaneous vaginal delivery) 07/05/2017  . Shortness of breath   . [redacted] weeks gestation of pregnancy   . Gastroesophageal reflux disease   . Chest pain 01/02/2017  . Hyponatremia 01/02/2017  . Hypokalemia 01/02/2017  . Pregnancy 01/02/2017  . Leukocytosis 01/02/2017    Past Surgical History:  Procedure Laterality Date  . DENTAL SURGERY  2011   wisdom teeth removed  . NO PAST SURGERIES       OB History    Gravida  2   Para  1   Term  1   Preterm     AB  1   Living  1     SAB  1   TAB      Ectopic      Multiple  0   Live Births  1            Home Medications    Prior to Admission medications   Medication Sig Start Date End Date Taking? Authorizing Provider  dicyclomine (BENTYL) 10 MG capsule Take 1 capsule (10 mg total) by mouth 3 (three) times daily as needed for spasms. 05/29/18   Meryl Dare, MD  diphenhydrAMINE (BENADRYL) 25 mg capsule Take 50 mg by mouth daily as needed for allergies.    [provider]  famotidine (PEPCID) 20 MG tablet Take 1 tablet (20 mg total) by mouth daily. Patient taking differently: Take 20 mg by mouth daily as needed.  01/05/17   Mikhail, Nita Sells, DO  ibuprofen (ADVIL,MOTRIN) 600 MG tablet Take 1 tablet (600 mg total) by mouth every 6 (six) hours as needed for moderate pain or cramping. 07/07/17   Key, Jearld Lesch, DO  metoCLOPramide (REGLAN) 10 MG tablet Take 1 tablet (10 mg total) by mouth every 6 (six) hours. 03/23/18   Kirichenko, Tatyana, PA-C  ondansetron (ZOFRAN-ODT) 4 MG disintegrating tablet Take 1 tablet (4 mg total) by mouth every 8 (eight) hours as needed for nausea or vomiting. 03/14/18   Georgetta Haber, NP  pantoprazole (  PROTONIX) 40 MG tablet Take 1 tablet (40 mg total) by mouth daily. 05/29/18   Meryl DareStark, Malcolm T, MD  sucralfate (CARAFATE) 1 g tablet Take 1 tablet (1 g total) by mouth 4 (four) times daily -  with meals and at bedtime. 03/16/18   Loren RacerYelverton, David, MD    Family History Family History  Problem Relation Age of Onset  . Hypertension Father   . Hypertension Paternal Grandfather   . Esophageal cancer Paternal Grandfather   . Colon cancer Neg Hx     Social History Social History   Tobacco Use  . Smoking status: Never Smoker  . Smokeless tobacco: Never Used  Substance Use Topics  . Alcohol use: Yes    Comment: ocassionally  . Drug use: No     Allergies   Penicillins; Pineapple; and Food   Review of Systems Review of Systems  Constitutional:  Negative for chills and fever.  Eyes: Negative for visual disturbance.  Respiratory: Negative for shortness of breath.   Cardiovascular: Negative for chest pain.  Gastrointestinal: Positive for abdominal pain (epigastric pain), diarrhea, nausea and vomiting. Negative for blood in stool and constipation.  Genitourinary: Negative for difficulty urinating, dysuria, flank pain, frequency, hematuria, vaginal bleeding and vaginal discharge.  Musculoskeletal: Negative for back pain.  Skin: Negative for rash.  Neurological: Negative for dizziness, syncope, weakness, light-headedness and numbness.  Psychiatric/Behavioral: Negative for agitation.     Physical Exam Updated Vital Signs BP 131/87 (BP Location: Right Arm)   Pulse 73   Temp 98.9 F (37.2 C) (Oral)   Resp 20   Ht 5\' 4"  (1.626 m)   Wt 59 kg   LMP 06/25/2018   SpO2 100%   BMI 22.31 kg/m   Physical Exam  Constitutional: She is oriented to person, place, and time. She appears well-developed and well-nourished. No distress.  Tearful, holding her abdomen and retching.   HENT:  Head: Normocephalic and atraumatic.  Moist mucous membranes.   Eyes: Pupils are equal, round, and reactive to light. Conjunctivae are normal. Right eye exhibits no discharge. Left eye exhibits no discharge.  Neck: Normal range of motion. Neck supple.  Cardiovascular: Normal rate, regular rhythm and intact distal pulses.  Pulmonary/Chest: Effort normal and breath sounds normal. No stridor. No respiratory distress. She has no wheezes. She has no rales.  Abdominal:  Abdomen soft and non-distended. Bowel sounds normoactive. Abdomen generally tender, although more focally tender in the epigastrium. No guarding or rigidity. No rebound tenderness. No CVA tenderness.   Musculoskeletal: Normal range of motion.  Neurological: She is alert and oriented to person, place, and time. Coordination normal.  Skin: Skin is warm and dry. Capillary refill takes less than 2  seconds. She is not diaphoretic.  Psychiatric: She has a normal mood and affect. Her behavior is normal.  Nursing note and vitals reviewed.   ED Treatments / Results  Labs (all labs ordered are listed, but only abnormal results are displayed) Labs Reviewed  COMPREHENSIVE METABOLIC PANEL - Abnormal; Notable for the following components:      Result Value   Potassium 3.3 (*)    CO2 18 (*)    Glucose, Bld 128 (*)    Total Protein 8.2 (*)    All other components within normal limits  URINALYSIS, ROUTINE W REFLEX MICROSCOPIC - Abnormal; Notable for the following components:   Color, Urine AMBER (*)    APPearance HAZY (*)    Specific Gravity, Urine 1.031 (*)    Ketones, ur 80 (*)  Protein, ur >=300 (*)    All other components within normal limits  LIPASE, BLOOD  CBC  I-STAT BETA HCG BLOOD, ED ([facility], [facility], [facility] ONLY)    EKG None  Radiology No results found.  Procedures Procedures (including critical care time)  Medications Ordered in ED Medications  potassium chloride SA (K-DUR,KLOR-CON) CR tablet 20 mEq (has no administration in time range)  ondansetron (ZOFRAN-ODT) disintegrating tablet 4 mg (4 mg Oral Given 07/20/18 1153)  ondansetron (ZOFRAN) injection 4 mg (4 mg Intravenous Given 07/20/18 1412)  sodium chloride 0.9 % bolus 1,000 mL (1,000 mLs Intravenous New Bag/Given (Non-Interop) 07/20/18 1459)  gi cocktail (Maalox,Lidocaine,Donnatal) (30 mLs Oral Given 07/20/18 1501)  promethazine (PHENERGAN) injection 25 mg (25 mg Intravenous Given 07/20/18 1500)     Initial Impression / Assessment and Plan / ED Course  I have reviewed the triage vital signs and the nursing notes.  Pertinent labs & imaging results that were available during my care of the patient were reviewed by me and considered in my medical decision making (see chart for details).     Patient presents with intractable vomiting and burning epigastric pain. Has a history of GERD and reports symptoms are similar to  prior exacerbations. Of note she ran out of her protonix and dicyclomine a few weeks ago.  On exam she is retching and appears uncomfortable.  Abdomen is soft and tender in the epigastrium.  No peritoneal signs.  Lab work reveals ketones in the urine as well as hypokalemia which are likely both related to her recent vomiting. Otherwise CBC unremarkable, no leukocytosis.  Liver enzymes and kidney function within normal limits.  Lipase negative.  Beta hCG negative.  Patient reports significant improvement after IV fluids, antiemetic and GI cocktail. She is tolerating ginger ale at the bedside. Repeat abdominal pain soft and non-tender. Patient's symptoms today likely exacerbation of her underlying GERD secondary to running out of her antacid. Given reassuring lab work and improvement with symptomatic management I doubt acute intraabdominal process requiring further imaging or lab work. Plan to start patient back on protonix and will discharge with zofran for at home use. Discussed return precautions and patient agrees and appears reliable.   Final Clinical Impressions(s) / ED Diagnoses   Final diagnoses:  Epigastric pain  Non-intractable vomiting with nausea, unspecified vomiting type    ED Discharge Orders         Ordered    promethazine (PHENERGAN) 25 MG suppository  Every 6 hours PRN,   Status:  Discontinued     07/20/18 1604    pantoprazole (PROTONIX) 40 MG tablet  Daily     07/20/18 1604    ondansetron (ZOFRAN ODT) 4 MG disintegrating tablet  Every 8 hours PRN     07/20/18 1630           Kellie Shropshire, PA-C 07/20/18 1631    Gwyneth Sprout, MD 07/21/18 [phone]

## 2018-07-20 NOTE — Discharge Instructions (Addendum)
It is important to stay well hydrated with plenty of fluids.   Start taking protonix daily to help with your GERD and vomiting. You can take phenergan suppository if you have vomiting.   Follow up with your GI doctor (Dr. Russella DarStark) for continuing management of your reflux and vomiting.   Please return if you have any new or concerning symptoms like fever, vomiting that will not stop, abdominal pain that is new or different than what you have had in the past.

## 2018-07-20 NOTE — ED Triage Notes (Signed)
Pt. Stated, Heather Joyce not been able to keep anything down since Wed. Pt. In triage vomiting.

## 2018-07-22 ENCOUNTER — Encounter (HOSPITAL_COMMUNITY): Payer: Self-pay | Admitting: Emergency Medicine

## 2018-07-22 ENCOUNTER — Emergency Department (HOSPITAL_COMMUNITY)
Admission: EM | Admit: 2018-07-22 | Discharge: 2018-07-22 | Disposition: A | Discharge status: Home / Self Care | Payer: Self-pay | Attending: Emergency Medicine | Admitting: Emergency Medicine

## 2018-07-22 DIAGNOSIS — R197 Diarrhea, unspecified: Secondary | ICD-10-CM | POA: Insufficient documentation

## 2018-07-22 DIAGNOSIS — R112 Nausea with vomiting, unspecified: Secondary | ICD-10-CM | POA: Insufficient documentation

## 2018-07-22 LAB — I-STAT BETA HCG BLOOD, ED (MC, WL, AP ONLY): I-stat hCG, quantitative: <5 m[IU]/mL

## 2018-07-22 LAB — URINALYSIS, ROUTINE W REFLEX MICROSCOPIC
Bacteria, UA: NONE SEEN
Bilirubin Urine: NEGATIVE
Glucose, UA: NEGATIVE mg/dL
Hgb urine dipstick: NEGATIVE
Ketones, ur: 20 mg/dL — AB
Leukocytes, UA: NEGATIVE
Nitrite: NEGATIVE
Protein, ur: 30 mg/dL — AB
Specific Gravity, Urine: 1.027 (ref 1.005–1.030)
pH: 5 (ref 5.0–8.0)

## 2018-07-22 LAB — COMPREHENSIVE METABOLIC PANEL
ALBUMIN: 5.1 g/dL — AB (ref 3.5–5.0)
ALT: 14 U/L (ref 0–44)
AST: 21 U/L (ref 15–41)
Alkaline Phosphatase: 52 U/L (ref 38–126)
Anion gap: 14 (ref 5–15)
BILIRUBIN TOTAL: 0.9 mg/dL (ref 0.3–1.2)
BUN: 9 mg/dL (ref 6–20)
CO2: 20 mmol/L — ABNORMAL LOW (ref 22–32)
CREATININE: 0.92 mg/dL (ref 0.44–1.00)
Calcium: 9.8 mg/dL (ref 8.9–10.3)
Chloride: 107 mmol/L (ref 98–111)
GFR calc Af Amer: >60 mL/min (ref >60)
GFR calc non Af Amer: >60 mL/min (ref >60)
GLUCOSE: 102 mg/dL — AB (ref 70–99)
Potassium: 3.2 mmol/L — ABNORMAL LOW (ref 3.5–5.1)
Sodium: 141 mmol/L (ref 135–145)
TOTAL PROTEIN: 8.8 g/dL — AB (ref 6.5–8.1)

## 2018-07-22 LAB — CBC
HCT: 40.4 % (ref 36.0–46.0)
Hemoglobin: 14.2 g/dL (ref 12.0–15.0)
MCH: 29.4 pg (ref 26.0–34.0)
MCHC: 35.1 g/dL (ref 30.0–36.0)
MCV: 83.6 fL (ref 78.0–100.0)
Platelets: 399 10*3/uL (ref 150–400)
RBC: 4.83 MIL/uL (ref 3.87–5.11)
RDW: 14.4 % (ref 11.5–15.5)
WBC: 11.3 10*3/uL — ABNORMAL HIGH (ref 4.0–10.5)

## 2018-07-22 LAB — LIPASE, BLOOD: Lipase: 37 U/L (ref 11–51)

## 2018-07-22 MED ORDER — HALOPERIDOL LACTATE 5 MG/ML IJ SOLN
2.0000 mg | Freq: Once | INTRAMUSCULAR | Status: AC
  Administered 2018-07-22: 2 mg via INTRAVENOUS
  Filled 2018-07-22: qty 1

## 2018-07-22 MED ORDER — SODIUM CHLORIDE 0.9 % IV BOLUS
1000.0000 mL | Freq: Once | INTRAVENOUS | Status: AC
  Administered 2018-07-22: 1000 mL via INTRAVENOUS

## 2018-07-22 MED ORDER — GI COCKTAIL ~~LOC~~
30.0000 mL | Freq: Once | ORAL | Status: AC
  Administered 2018-07-22: 30 mL via ORAL
  Filled 2018-07-22: qty 30

## 2018-07-22 MED ORDER — PROCHLORPERAZINE MALEATE 10 MG PO TABS: 10.0000 mg | ORAL_TABLET | Freq: Two times a day (BID) | ORAL | 1 refills | Status: DC | PRN

## 2018-07-22 MED ORDER — SUCRALFATE 1 G PO TABS: 1.0000 g | ORAL_TABLET | Freq: Four times a day (QID) | ORAL | 0 refills | Status: DC | PRN

## 2018-07-22 NOTE — ED Triage Notes (Signed)
Pt c/o of vomiting and diarrhea since last Wed. Denies zofran helping.

## 2018-07-22 NOTE — ED Provider Notes (Signed)
[facility] Emergency Department Provider Note MRN:  [id number]  Arrival date & time: 07/22/18     Chief Complaint   Emesis and Diarrhea   History of Present Illness   Heather Joyce is a 27 y.o. year-old female with a history of chronic GI disturbance presenting to the ED with chief complaint of nausea, vomiting, diarrhea.  Patient is followed by GI, patient explains that she intermittently has bouts of severe nausea, vomiting, diarrhea.  This episode began 4 days ago.  Countless episodes of nonbloody nonbilious emesis, watery diarrhea.  Was seen at [facility] emergency department 2 days ago, felt better after medications, was discharged.  Symptoms have returned despite Zofran and Protonix at home.  Symptoms are moderate in severity, constant, no exacerbating or alleviating factors.  Denies abdominal pain, no fevers, no chest pain or shortness of breath, no vaginal bleeding or discharge.  Review of Systems  A complete 10 system review of systems was obtained and all systems are negative except as noted in the HPI and PMH.   Patient's Health History    Past Medical History:  Diagnosis Date  . 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 DENTAL SURGERY  2011   wisdom teeth removed  . NO PAST SURGERIES      Family History  Problem Relation Age of Onset  . Hypertension Father   . Hypertension Paternal Grandfather   . Esophageal cancer Paternal Grandfather   . Colon cancer Neg Hx     Social History   Socioeconomic History  . Marital status: Single    Spouse name: Not on file  . Number of children: Not on file  . Years of education: Not on file  . Highest education level: Not on file  Occupational History  . Not on file  Social Needs  . Financial resource strain: Not on file  . Food insecurity:    Worry: Not on file    Inability: Not on file  . Transportation needs:    Medical: Not on file    Non-medical: Not on file    Tobacco Use  . Smoking status: Never Smoker  . Smokeless tobacco: Never Used  Substance and Sexual Activity  . Alcohol use: Yes    Comment: ocassionally  . Drug use: No  . Sexual activity: Yes    Birth control/protection: Condom  Lifestyle  . Physical activity:    Days per week: Not on file    Minutes per session: Not on file  . Stress: Not on file  Relationships  . Social connections:    Talks on phone: Not on file    Gets together: Not on file    Attends religious service: Not on file    Active member of club or organization: Not on file    Attends meetings of clubs or organizations: Not on file    Relationship status: Not on file  . Intimate partner violence:    Fear of current or ex partner: Not on file    Emotionally abused: Not on file    Physically abused: Not on file    Forced sexual activity: Not on file  Other Topics Concern  . Not on file  Social History Narrative  . Not on file     Physical Exam  Vital Signs and Nursing Notes reviewed Vitals:   07/22/18 1521 07/22/18 1733  BP: (!) 148/69 138/68  Pulse: 71 77  Resp: 16 16  Temp:    SpO2: 99% 100%    CONSTITUTIONAL: Well-appearing, NAD NEURO:  Alert and oriented x 3, no focal deficits EYES:  eyes equal and reactive ENT/NECK:  no LAD, no JVD CARDIO: Regular rate, well-perfused, normal S1 and S2 PULM:  CTAB no wheezing or rhonchi GI/GU:  normal bowel sounds, non-distended, non-tender MSK/SPINE:  No gross deformities, no edema SKIN:  no rash, atraumatic PSYCH:  Appropriate speech and behavior  Diagnostic and Interventional Summary    EKG Interpretation  Date/Time:    Ventricular Rate:    PR Interval:    QRS Duration:   QT Interval:    QTC Calculation:   R Axis:     Text Interpretation:        Labs Reviewed  COMPREHENSIVE METABOLIC PANEL - Abnormal; Notable for the following components:      Result Value   Potassium 3.2 (*)    CO2 20 (*)    Glucose, Bld 102 (*)    Total Protein 8.8  (*)    Albumin 5.1 (*)    All other components within normal limits  CBC - Abnormal; Notable for the following components:   WBC 11.3 (*)    All other components within normal limits  LIPASE, BLOOD  URINALYSIS, ROUTINE W REFLEX MICROSCOPIC  I-STAT BETA HCG BLOOD, ED ([facility], [facility], [facility] ONLY)    No orders to display    Medications  sodium chloride 0.9 % bolus 1,000 mL (0 mLs Intravenous Stopped 07/22/18 1624)  haloperidol lactate (HALDOL) injection 2 mg (2 mg Intravenous Given 07/22/18 1521)  sodium chloride 0.9 % bolus 1,000 mL (0 mLs Intravenous Stopped 07/22/18 1625)  gi cocktail (Maalox,Lidocaine,Donnatal) (30 mLs Oral Given 07/22/18 1521)     Procedures Critical Care  ED Course and Medical Decision Making  I have reviewed the triage vital signs and the nursing notes.  Pertinent labs & imaging results that were available during my care of the patient were reviewed by me and considered in my medical decision making (see below for details).    Considering gastroparesis versus viral gastroenteritis versus other chronic GI condition in this 27 year old female with persistent nausea, vomiting, diarrhea.  Denies daily use of marijuana.  Will attempt symptom control and discharge.  Patient feeling much better after IV Haldol, GI cocktail.  Requesting discharge.  Does not want to wait for her urinalysis result.  Has not been having any dysuria.  Will follow up with GI.  After the discussed management above, the patient was determined to be safe for discharge.  The patient was in agreement with this plan and all questions regarding their care were answered.  ED return precautions were discussed and the patient will return to the ED with any significant worsening of condition.  Elmer SowMichael M. Pilar PlateBero, MD [facility] [email or web address]  Final Clinical Impressions(s) / ED Diagnoses     ICD-10-CM   1. Non-intractable vomiting with nausea, unspecified  vomiting type R11.2   2. Diarrhea, unspecified type R19.7     ED Discharge Orders         Ordered    prochlorperazine (COMPAZINE) 10 MG tablet  2 times daily PRN     07/22/18 1737    sucralfate (CARAFATE) 1 g tablet  4 times daily PRN     07/22/18 1737             Sabas SousBero, Bernd Crom M, MD 07/22/18 1740

## 2018-07-22 NOTE — Discharge Instructions (Addendum)
You were evaluated in the Emergency Department and after careful evaluation, we did not find any emergent condition requiring admission or further testing in the hospital.  Please return to the Emergency Department if you experience any worsening of your condition.  We encourage you to follow up with a primary care provider and a gastroenterologist.  Thank you for allowing us to be a part of your care.

## 2018-07-25 ENCOUNTER — Ambulatory Visit (INDEPENDENT_AMBULATORY_CARE_PROVIDER_SITE_OTHER): Payer: Self-pay | Admitting: Gastroenterology

## 2018-07-25 ENCOUNTER — Encounter: Payer: Self-pay | Admitting: Gastroenterology

## 2018-07-25 VITALS — BP 104/68 | HR 62 | Ht 64.0 in | Wt 133.2 lb

## 2018-07-25 DIAGNOSIS — K219 Gastro-esophageal reflux disease without esophagitis: Secondary | ICD-10-CM

## 2018-07-25 DIAGNOSIS — K582 Mixed irritable bowel syndrome: Secondary | ICD-10-CM

## 2018-07-25 MED ORDER — ONDANSETRON 4 MG PO TBDP: 4.0000 mg | ORAL_TABLET | Freq: Three times a day (TID) | ORAL | 3 refills | Status: DC | PRN

## 2018-07-25 MED ORDER — PANTOPRAZOLE SODIUM 40 MG PO TBEC: 40.0000 mg | DELAYED_RELEASE_TABLET | Freq: Every day | ORAL | 11 refills | Status: DC

## 2018-07-25 MED ORDER — DICYCLOMINE HCL 10 MG PO CAPS: 10.0000 mg | ORAL_CAPSULE | Freq: Three times a day (TID) | ORAL | 11 refills | Status: DC

## 2018-07-25 NOTE — Progress Notes (Signed)
    History of Present Illness: This is a 27 year old female GERD and IBS, alternative pattern.  Her symptoms were very well controlled on pantoprazole and dicyclomine.  She completed both prescriptions and discontinued the medication and her symptoms returned.  She was evaluated in the ED on 9/23 for refractory nausea and vomiting.  I reviewed the evaluation and blood work performed in the ED.  She states Zofran has been helpful to address nausea and vomiting however the symptoms were not present during the time she was taking pantoprazole and dicyclomine.    Current Medications, Allergies, Past Medical History, Past Surgical History, Family History and Social History were reviewed in [employer] record.  Physical Exam: General: Well developed, well nourished, no acute distress Head: Normocephalic and atraumatic Eyes:  sclerae anicteric, EOMI Ears: Normal auditory acuity Mouth: No deformity or lesions Lungs: Clear throughout to auscultation Heart: Regular rate and rhythm; no murmurs, rubs or bruits Abdomen: Soft, non tender and non distended. No masses, hepatosplenomegaly or hernias noted. Normal Bowel sounds Rectal: Not done Musculoskeletal: Symmetrical with no gross deformities  Pulses:  Normal pulses noted Extremities: No clubbing, cyanosis, edema or deformities noted Neurological: Alert oriented x 4, grossly nonfocal Psychological:  Alert and cooperative. Normal mood and affect   Assessment and Recommendations:  1. GERD.  Follow standard antireflux measures.  Resume pantoprazole 40 mg p.o. daily as a long-term treatment for GERD.  Again offered EGD for further evaluation however since her symptoms were controlled on medication she does not wish to proceed at this time. REV in 6 months.  2. IBS.  Avoid foods that trigger symptoms.  Resume dicyclomine 10 mg 3 times daily AC as a long-term medication to control symptoms. REV in 6 months.

## 2018-07-25 NOTE — Patient Instructions (Signed)
We have sent the following medications to your pharmacy for you to pick up at your convenience: Protonix, zofran and dicyclomine.   Thank you for choosing me and [facility].  Venita Lick. Pleas Koch., MD., Clementeen Graham

## 2018-08-07 ENCOUNTER — Telehealth: Payer: Self-pay | Admitting: Gastroenterology

## 2018-08-07 MED ORDER — PANTOPRAZOLE SODIUM 40 MG PO TBEC: 40.0000 mg | DELAYED_RELEASE_TABLET | Freq: Every day | ORAL | 11 refills | Status: DC

## 2018-08-07 NOTE — Telephone Encounter (Signed)
Prescription resent to patient's pharmacy. Attempted to notify patient but phone call could not be completed.

## 2019-02-25 ENCOUNTER — Encounter (HOSPITAL_COMMUNITY): Payer: Self-pay | Admitting: Emergency Medicine

## 2019-02-25 ENCOUNTER — Emergency Department (HOSPITAL_COMMUNITY)
Admission: EM | Admit: 2019-02-25 | Discharge: 2019-02-25 | Discharge status: Against Medical Advice | Payer: Self-pay | Attending: Emergency Medicine | Admitting: Emergency Medicine

## 2019-02-25 ENCOUNTER — Other Ambulatory Visit: Payer: Self-pay

## 2019-02-25 DIAGNOSIS — R1110 Vomiting, unspecified: Secondary | ICD-10-CM | POA: Insufficient documentation

## 2019-02-25 LAB — CBC
HCT: 38.8 % (ref 36.0–46.0)
Hemoglobin: 13.2 g/dL (ref 12.0–15.0)
MCH: 29.6 pg (ref 26.0–34.0)
MCHC: 34 g/dL (ref 30.0–36.0)
MCV: 87 fL (ref 80.0–100.0)
Platelets: 357 10*3/uL (ref 150–400)
RBC: 4.46 MIL/uL (ref 3.87–5.11)
RDW: 14.5 % (ref 11.5–15.5)
WBC: 14.5 10*3/uL — ABNORMAL HIGH (ref 4.0–10.5)
nRBC: 0 % (ref 0.0–0.2)

## 2019-02-25 LAB — COMPREHENSIVE METABOLIC PANEL
ALT: 12 U/L (ref 0–44)
AST: 19 U/L (ref 15–41)
Albumin: 4.7 g/dL (ref 3.5–5.0)
Alkaline Phosphatase: 51 U/L (ref 38–126)
Anion gap: 16 — ABNORMAL HIGH (ref 5–15)
BUN: 6 mg/dL (ref 6–20)
CO2: 21 mmol/L — ABNORMAL LOW (ref 22–32)
Calcium: 9.9 mg/dL (ref 8.9–10.3)
Chloride: 104 mmol/L (ref 98–111)
Creatinine, Ser: 0.81 mg/dL (ref 0.44–1.00)
GFR calc Af Amer: >60 mL/min (ref >60)
GFR calc non Af Amer: >60 mL/min (ref >60)
Glucose, Bld: 135 mg/dL — ABNORMAL HIGH (ref 70–99)
Potassium: 3.4 mmol/L — ABNORMAL LOW (ref 3.5–5.1)
Sodium: 141 mmol/L (ref 135–145)
Total Bilirubin: 0.8 mg/dL (ref 0.3–1.2)
Total Protein: 7.9 g/dL (ref 6.5–8.1)

## 2019-02-25 LAB — LIPASE, BLOOD: Lipase: 26 U/L (ref 11–51)

## 2019-02-25 LAB — I-STAT BETA HCG BLOOD, ED (MC, WL, AP ONLY): I-stat hCG, quantitative: <5 m[IU]/mL (ref <5)

## 2019-02-25 MED ORDER — HALOPERIDOL LACTATE 5 MG/ML IJ SOLN
2.0000 mg | Freq: Once | INTRAMUSCULAR | Status: AC
  Administered 2019-02-25: 19:00:00 2 mg via INTRAVENOUS
  Filled 2019-02-25: qty 1

## 2019-02-25 MED ORDER — SODIUM CHLORIDE 0.9 % IV BOLUS
1000.0000 mL | Freq: Once | INTRAVENOUS | Status: AC
  Administered 2019-02-25: 19:00:00 1000 mL via INTRAVENOUS

## 2019-02-25 NOTE — ED Triage Notes (Signed)
Pt reports nausea and vomiting since this morning. States this is a chronic issue.

## 2019-02-25 NOTE — ED Provider Notes (Addendum)
[facility] Emergency Department Provider Note MRN:  [id number]  Arrival date & time: 02/25/19     Chief Complaint   Emesis   History of Present Illness   Heather Joyce is a 28 y.o. year-old female with a history of chronic GI disturbance presenting to the ED with chief complaint of emesis.  Patient explains that she frequently experiences cyclical vomiting, but it has been well controlled for several months.  Today began having a very similar feeling of her prior flares.  Countless episodes of nonbloody nonbilious emesis.  Few episodes of watery diarrhea.  Denies headache or vision change, no neck pain, no chest pain or shortness of breath, no abdominal pain, no dysuria.  Symptoms are constant, no exacerbating relieving factors.  Review of Systems  A complete 10 system review of systems was obtained and all systems are negative except as noted in the HPI and PMH.   Patient's Health History    Past Medical History:  Diagnosis Date  . 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 DENTAL SURGERY  2011   wisdom teeth removed    Family History  Problem Relation Age of Onset  . Hypertension Father   . Hypertension Paternal Grandfather   . Esophageal cancer Paternal Grandfather   . Colon cancer Neg Hx     Social History   Socioeconomic History  . Marital status: Single    Spouse name: Not on file  . Number of children: 1  . Years of education: Not on file  . Highest education level: Not on file  Occupational History  . Not on file  Social Needs  . Financial resource strain: Not on file  . Food insecurity:    Worry: Not on file    Inability: Not on file  . Transportation needs:    Medical: Not on file    Non-medical: Not on file  Tobacco Use  . Smoking status: Never Smoker  . Smokeless tobacco: Never Used  Substance and Sexual Activity  . Alcohol use: Yes    Comment: ocassionally  . Drug use: No  . Sexual activity:  Yes    Birth control/protection: Condom  Lifestyle  . Physical activity:    Days per week: Not on file    Minutes per session: Not on file  . Stress: Not on file  Relationships  . Social connections:    Talks on phone: Not on file    Gets together: Not on file    Attends religious service: Not on file    Active member of club or organization: Not on file    Attends meetings of clubs or organizations: Not on file    Relationship status: Not on file  . Intimate partner violence:    Fear of current or ex partner: Not on file    Emotionally abused: Not on file    Physically abused: Not on file    Forced sexual activity: Not on file  Other Topics Concern  . Not on file  Social History Narrative  . Not on file     Physical Exam  Vital Signs and Nursing Notes reviewed Vitals:   02/25/19 1812 02/25/19 1815  BP: (!) 142/89 (!) 153/106  Pulse: 73   Resp: 18   Temp: 97.9 F (36.6 C)   SpO2: 98%     CONSTITUTIONAL: Ill-appearing, appears uncomfortable, appears dehydrated NEURO:  Alert and oriented x 3, no focal deficits EYES:  eyes equal and reactive ENT/NECK:  no LAD, no JVD CARDIO: Regular rate, well-perfused, normal S1 and S2 PULM:  CTAB no wheezing or rhonchi GI/GU:  normal bowel sounds, non-distended, non-tender MSK/SPINE:  No gross deformities, no edema SKIN:  no rash, atraumatic PSYCH:  Appropriate speech and behavior  Diagnostic and Interventional Summary    EKG Interpretation  Date/Time:  Tuesday February 25 2019 18:39:29 EDT Ventricular Rate:  75 PR Interval:    QRS Duration: 91 QT Interval:  414 QTC Calculation: 463 R Axis:   96 Text Interpretation:  Sinus rhythm Borderline right axis deviation Confirmed by Kennis CarinaBero, Gia Lusher [id number]) on 02/25/2019 7:00:56 PM      Labs Reviewed  CBC  COMPREHENSIVE METABOLIC PANEL  LIPASE, BLOOD  I-STAT BETA HCG BLOOD, ED ([facility], [facility], [facility] ONLY)    No orders to display    Medications  sodium chloride 0.9 % bolus 1,000 mL (1,000  mLs Intravenous New Bag/Given 02/25/19 1843)  haloperidol lactate (HALDOL) injection 2 mg (2 mg Intravenous Given 02/25/19 1842)     Procedures Critical Care  ED Course and Medical Decision Making  I have reviewed the triage vital signs and the nursing notes.  Pertinent labs & imaging results that were available during my care of the patient were reviewed by me and considered in my medical decision making (see below for details).  Looks and feels similar to prior flares of what sounds like cyclical vomiting in this 28 year old female.  Abdomen is very soft, completely nontender.  Vital signs are normal, no fever.  Patient responded well to haloperidol and fluids last time this occurred.  Will obtain labs, hCG, reassess after medication.  Labs pending, will need reassessment, p.o. challenge.  Anticipating that we get her feeling better and she goes home.    Patient informed us that she had a home emergency and needed to leave immediately.  Left AGAINST MEDICAL ADVICE, fully aware of the risk of worsening condition and/or death.  Elmer SowMichael M. Pilar PlateBero, MD [facility] [email or web address]  Final Clinical Impressions(s) / ED Diagnoses     ICD-10-CM   1. Vomiting, intractability of vomiting not specified, presence of nausea not specified, unspecified vomiting type R11.10     ED Discharge Orders    None         Sabas SousBero, Mckyla Deckman M, MD 02/25/19 Paulina [id number]    Karson Chicas M, MD 02/25/19 [phone]
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
## 2019-02-27 ENCOUNTER — Telehealth: Payer: Self-pay | Admitting: Gastroenterology

## 2019-02-27 NOTE — Telephone Encounter (Signed)
Pt has been experiencing nausea and acid reflux, she wants to know if Dr. Russella Dar can prescribe something.

## 2019-02-27 NOTE — Telephone Encounter (Signed)
Attempted to return call.  Phone rings once then get a busy  [employer].  I will continue to try and reach the patient

## 2019-03-04 ENCOUNTER — Emergency Department (HOSPITAL_COMMUNITY)
Admission: EM | Admit: 2019-03-04 | Discharge: 2019-03-04 | Disposition: A | Discharge status: Home / Self Care | Payer: Self-pay | Attending: Emergency Medicine | Admitting: Emergency Medicine

## 2019-03-04 ENCOUNTER — Other Ambulatory Visit: Payer: Self-pay

## 2019-03-04 ENCOUNTER — Encounter (HOSPITAL_COMMUNITY): Payer: Self-pay | Admitting: Emergency Medicine

## 2019-03-04 DIAGNOSIS — Z79899 Other long term (current) drug therapy: Secondary | ICD-10-CM | POA: Insufficient documentation

## 2019-03-04 DIAGNOSIS — R112 Nausea with vomiting, unspecified: Secondary | ICD-10-CM | POA: Insufficient documentation

## 2019-03-04 LAB — COMPREHENSIVE METABOLIC PANEL
ALT: 10 U/L (ref 0–44)
AST: 15 U/L (ref 15–41)
Albumin: 5 g/dL (ref 3.5–5.0)
Alkaline Phosphatase: 48 U/L (ref 38–126)
Anion gap: 12 (ref 5–15)
BUN: 9 mg/dL (ref 6–20)
CO2: 22 mmol/L (ref 22–32)
Calcium: 9.6 mg/dL (ref 8.9–10.3)
Chloride: 103 mmol/L (ref 98–111)
Creatinine, Ser: 0.73 mg/dL (ref 0.44–1.00)
GFR calc Af Amer: >60 mL/min (ref >60)
GFR calc non Af Amer: >60 mL/min (ref >60)
Glucose, Bld: 121 mg/dL — ABNORMAL HIGH (ref 70–99)
Potassium: 3.4 mmol/L — ABNORMAL LOW (ref 3.5–5.1)
Sodium: 137 mmol/L (ref 135–145)
Total Bilirubin: 0.6 mg/dL (ref 0.3–1.2)
Total Protein: 8.2 g/dL — ABNORMAL HIGH (ref 6.5–8.1)

## 2019-03-04 LAB — URINALYSIS, ROUTINE W REFLEX MICROSCOPIC
Bilirubin Urine: NEGATIVE
Glucose, UA: NEGATIVE mg/dL
Hgb urine dipstick: NEGATIVE
Ketones, ur: 80 mg/dL — AB
Leukocytes,Ua: NEGATIVE
Nitrite: NEGATIVE
Protein, ur: 100 mg/dL — AB
Specific Gravity, Urine: 1.026 (ref 1.005–1.030)
pH: 9 — ABNORMAL HIGH (ref 5.0–8.0)

## 2019-03-04 LAB — CBC WITH DIFFERENTIAL/PLATELET
Abs Immature Granulocytes: 0.03 10*3/uL (ref 0.00–0.07)
Basophils Absolute: 0 10*3/uL (ref 0.0–0.1)
Basophils Relative: 0 %
Eosinophils Absolute: 0 10*3/uL (ref 0.0–0.5)
Eosinophils Relative: 0 %
HCT: 38.6 % (ref 36.0–46.0)
Hemoglobin: 12.9 g/dL (ref 12.0–15.0)
Immature Granulocytes: 0 %
Lymphocytes Relative: 7 %
Lymphs Abs: 0.6 10*3/uL — ABNORMAL LOW (ref 0.7–4.0)
MCH: 29.2 pg (ref 26.0–34.0)
MCHC: 33.4 g/dL (ref 30.0–36.0)
MCV: 87.3 fL (ref 80.0–100.0)
Monocytes Absolute: 0.4 10*3/uL (ref 0.1–1.0)
Monocytes Relative: 4 %
Neutro Abs: 7.9 10*3/uL — ABNORMAL HIGH (ref 1.7–7.7)
Neutrophils Relative %: 89 %
Platelets: 318 10*3/uL (ref 150–400)
RBC: 4.42 MIL/uL (ref 3.87–5.11)
RDW: 14.5 % (ref 11.5–15.5)
WBC: 8.9 10*3/uL (ref 4.0–10.5)
nRBC: 0 % (ref 0.0–0.2)

## 2019-03-04 LAB — RAPID URINE DRUG SCREEN, HOSP PERFORMED
Amphetamines: NOT DETECTED
Barbiturates: NOT DETECTED
Benzodiazepines: NOT DETECTED
Cocaine: NOT DETECTED
Opiates: NOT DETECTED
Tetrahydrocannabinol: POSITIVE — AB

## 2019-03-04 LAB — I-STAT BETA HCG BLOOD, ED (MC, WL, AP ONLY): I-stat hCG, quantitative: <5 m[IU]/mL (ref <5)

## 2019-03-04 LAB — LIPASE, BLOOD: Lipase: 27 U/L (ref 11–51)

## 2019-03-04 MED ORDER — ALUM & MAG HYDROXIDE-SIMETH 200-200-20 MG/5ML PO SUSP
30.0000 mL | Freq: Once | ORAL | Status: AC
  Administered 2019-03-04: 30 mL via ORAL
  Filled 2019-03-04: qty 30

## 2019-03-04 MED ORDER — METOCLOPRAMIDE HCL 5 MG/ML IJ SOLN
10.0000 mg | Freq: Once | INTRAMUSCULAR | Status: AC
  Administered 2019-03-04: 10 mg via INTRAVENOUS
  Filled 2019-03-04: qty 2

## 2019-03-04 MED ORDER — LIDOCAINE VISCOUS HCL 2 % MT SOLN
15.0000 mL | Freq: Once | OROMUCOSAL | Status: AC
  Administered 2019-03-04: 15 mL via ORAL
  Filled 2019-03-04: qty 15

## 2019-03-04 MED ORDER — FAMOTIDINE IN NACL 20-0.9 MG/50ML-% IV SOLN
20.0000 mg | INTRAVENOUS | Status: AC
  Administered 2019-03-04: 20 mg via INTRAVENOUS
  Filled 2019-03-04: qty 50

## 2019-03-04 MED ORDER — HALOPERIDOL LACTATE 5 MG/ML IJ SOLN: 5.0000 mg | Freq: Once | INTRAMUSCULAR | Status: DC

## 2019-03-04 MED ORDER — SODIUM CHLORIDE 0.9 % IV BOLUS
1000.0000 mL | Freq: Once | INTRAVENOUS | Status: AC
  Administered 2019-03-04: 1000 mL via INTRAVENOUS

## 2019-03-04 MED ORDER — ONDANSETRON HCL 4 MG/2ML IJ SOLN
4.0000 mg | Freq: Once | INTRAMUSCULAR | Status: AC
  Administered 2019-03-04: 4 mg via INTRAVENOUS
  Filled 2019-03-04: qty 2

## 2019-03-04 MED ORDER — ONDANSETRON 4 MG PO TBDP: 4.0000 mg | ORAL_TABLET | Freq: Three times a day (TID) | ORAL | 0 refills | Status: DC | PRN

## 2019-03-04 MED ORDER — HALOPERIDOL LACTATE 5 MG/ML IJ SOLN
2.0000 mg | Freq: Once | INTRAMUSCULAR | Status: DC
  Filled 2019-03-04: qty 1

## 2019-03-04 NOTE — ED Notes (Signed)
PT unable to provide urine sample at this time

## 2019-03-04 NOTE — ED Triage Notes (Signed)
Pt reports having nausea and vomiting since 4/28 and was seen at [facility] but left after not getting treated.

## 2019-03-04 NOTE — ED Provider Notes (Signed)
Patient signed out to me by L. Allyne Gee, PA-C.  Please see previous notes for further history.  In brief, patient presenting with epigastric burning and nausea.  This is been going on for several days.  Patient states this feels like GERD, which she has frequently.  She reports history of flareups.  She states she has been increasing her alcohol intake recently, which could be the cause of her symptoms.  No fevers or chills. Labs reassuring. She has had fluids, zofran, and GI coctail. Plan to finish tx and d/c with f/u with GI.  Per RN, pt requesting further medication for nausea. Will give Reglan and reassess.  Nausea improved with Reglan.  Tolerating ginger ale without difficulty.  At this time, patient appears safe for discharge.  Follow-up with GI for further evaluation.  Return precautions given.  Patient states she understands and agrees plan.    Alveria Apley, PA-C 03/04/19 0752    Virgina Norfolk, DO 03/04/19 [phone]

## 2019-03-04 NOTE — ED Notes (Signed)
Pt laying down talking on the phone, requesting someone be allowed to come visit her. This nurse explained current visitor restriction.

## 2019-03-04 NOTE — ED Provider Notes (Signed)
[facility]-EMERGENCY DEPT Provider Note   CSN: [id number] Arrival date & time: 03/04/19  0532    History   Chief Complaint Chief Complaint  Patient presents with  . Emesis    HPI Heather Joyce is a 28 y.o. female.     The history is provided by the patient and medical records.  Emesis  Associated symptoms: abdominal pain      28 year old female with history of GERD, presenting to the ED with abdominal pain.  States this is been ongoing for a few days now.  She describes it as "burning".  States she is not sure why but this happens to her every few months or so.  Reports she will have bad pain for several days to weeks but then it will continuously get better.  She has seen GI in the past, Dr. Russella Dar.  She is on Protonix which she has been taking as directed.  She denies eating any spicy or acidic foods lately.  She has had some alcohol as well as chocolate.  She reports a few episodes of nausea, vomiting, and lots of belching.  She has not had a bowel movement a few days but thinks it is because she cannot hold anything down.  She has not had any fever or chills.  No sick exposures.  No prior abdominal surgeries.  Does have history of THC use in the past.  She is requesting GI cocktail.  Past Medical History:  Diagnosis Date  . 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 SVD (spontaneous vaginal delivery) 07/05/2017  . Shortness of breath   . [redacted] weeks gestation of pregnancy   . Gastroesophageal reflux disease   . Chest pain 01/02/2017  . Hyponatremia 01/02/2017  . Hypokalemia 01/02/2017  . Pregnancy 01/02/2017  . Leukocytosis 01/02/2017    Past Surgical History:  Procedure Laterality Date  . DENTAL SURGERY  2011   wisdom teeth removed     OB History    Gravida  2   Para  1   Term  1   Preterm      AB  1   Living  1     SAB  1   TAB      Ectopic      Multiple  0   Live Births  1             Home Medications    Prior to Admission medications   Medication Sig Start Date End Date Taking? Authorizing Provider  dicyclomine (BENTYL) 10 MG capsule Take 1 capsule (10 mg total) by mouth 3 (three) times daily before meals. 07/25/18   Meryl Dare, MD  ondansetron (ZOFRAN ODT) 4 MG disintegrating tablet Take 1 tablet (4 mg total) by mouth every 8 (eight) hours as needed for nausea or vomiting. 07/25/18   Meryl Dare, MD  pantoprazole (PROTONIX) 40 MG tablet Take 1 tablet (40 mg total) by mouth daily. 08/07/18   Meryl Dare, MD    Family History Family History  Problem Relation Age of Onset  . Hypertension Father   . Hypertension Paternal Grandfather   . Esophageal cancer Paternal Grandfather   . Colon cancer Neg Hx     Social History Social History   Tobacco Use  . Smoking status: Never Smoker  . Smokeless tobacco: Never Used  Substance Use Topics  . Alcohol use: Yes    Comment: ocassionally  .  Drug use: No     Allergies   Penicillins; Pineapple; and Food   Review of Systems Review of Systems  Gastrointestinal: Positive for abdominal pain, nausea and vomiting.  All other systems reviewed and are negative.    Physical Exam Updated Vital Signs BP 123/65 (BP Location: Right Arm)   Pulse 89   Temp 98 F (36.7 C) (Oral)   Resp 16   Ht 5\' 4"  (1.626 m)   Wt 54.4 kg   LMP 02/21/2019 (Approximate)   SpO2 100%   BMI 20.60 kg/m   Physical Exam Vitals signs and nursing note reviewed.  Constitutional:      Appearance: She is well-developed.  HENT:     Head: Normocephalic and atraumatic.  Eyes:     Conjunctiva/sclera: Conjunctivae normal.     Pupils: Pupils are equal, round, and reactive to light.  Neck:     Musculoskeletal: Normal range of motion.  Cardiovascular:     Rate and Rhythm: Normal rate and regular rhythm.     Heart sounds: Normal heart sounds.  Pulmonary:     Effort: Pulmonary effort is normal.     Breath sounds: Normal breath  sounds.  Abdominal:     General: Bowel sounds are normal.     Palpations: Abdomen is soft.     Tenderness: There is abdominal tenderness in the epigastric area and left upper quadrant.     Comments: Mild tenderness epigastrium and LUQ without peritoneal signs, normal bowel sounds  Musculoskeletal: Normal range of motion.  Skin:    General: Skin is warm and dry.  Neurological:     Mental Status: She is alert and oriented to person, place, and time.      ED Treatments / Results  Labs (all labs ordered are listed, but only abnormal results are displayed) Labs Reviewed  CBC WITH DIFFERENTIAL/PLATELET - Abnormal; Notable for the following components:      Result Value   Neutro Abs 7.9 (*)    Lymphs Abs 0.6 (*)    All other components within normal limits  COMPREHENSIVE METABOLIC PANEL - Abnormal; Notable for the following components:   Potassium 3.4 (*)    Glucose, Bld 121 (*)    Total Protein 8.2 (*)    All other components within normal limits  LIPASE, BLOOD  RAPID URINE DRUG SCREEN, HOSP PERFORMED  URINALYSIS, ROUTINE W REFLEX MICROSCOPIC  I-STAT BETA HCG BLOOD, ED ([facility], [facility], [facility] ONLY)    EKG None  Radiology No results found.  Procedures Procedures (including critical care time)  Medications Ordered in ED Medications  famotidine (PEPCID) IVPB 20 mg premix (20 mg Intravenous New Bag/Given 03/04/19 0630)  sodium chloride 0.9 % bolus 1,000 mL (1,000 mLs Intravenous New Bag/Given 03/04/19 0636)  ondansetron (ZOFRAN) injection 4 mg (4 mg Intravenous Given 03/04/19 0618)  alum & mag hydroxide-simeth (MAALOX/MYLANTA) 200-200-20 MG/5ML suspension 30 mL (30 mLs Oral Given 03/04/19 0618)    And  lidocaine (XYLOCAINE) 2 % viscous mouth solution 15 mL (15 mLs Oral Given 03/04/19 [id number])     Initial Impression / Assessment and Plan / ED Course  I have reviewed the triage vital signs and the nursing notes.  Pertinent labs & imaging results that were available during my care of the patient  were reviewed by me and considered in my medical decision making (see chart for details).  28 y.o. F here with abdominal pain.  Epigastric and LUQ, described as burning.  Hx of GERD, has been drinking EtOH and  eating chocolate on occasion.  She is afebrile, non-toxic.  Mild tenderness of abdomen but no peritoneal signs.  Normal bowel sounds.  Labs pending.  Given GI cocktail, zofran, pepcid, IVF.  Labs reassuring.  UA and UDS pending.  IVF and medications infusing.  Care will be signed out to morning team for re-check and disposition.  Final Clinical Impressions(s) / ED Diagnoses   Final diagnoses:  Nausea and vomiting in adult    ED Discharge Orders         Ordered    ondansetron (ZOFRAN ODT) 4 MG disintegrating tablet  Every 8 hours PRN     03/04/19 0648           Garlon Hatchet, PA-C 03/04/19 2423    Shon Baton, MD 03/04/19 [phone]

## 2019-03-04 NOTE — ED Notes (Signed)
Pt ambulatory to bathroom. Specimen cup provided, pt encouraged to provide urine sample per MD order.

## 2019-03-04 NOTE — Discharge Instructions (Addendum)
Take the prescribed medication as directed. Follow-up with Dr. Russella Dar. Return to the ED for new or worsening symptoms.

## 2019-03-04 NOTE — ED Notes (Signed)
Pt d/c home per MD order. Discharge summary reviewed, pt verbalizes understanding. Reports ride is in parking lot. Ambulatory off unit. No s/s of distress noted.

## 2019-03-11 NOTE — Telephone Encounter (Signed)
No return calls from the patient and still not able to reach the patient

## 2019-05-26 ENCOUNTER — Other Ambulatory Visit: Payer: Self-pay

## 2019-05-26 ENCOUNTER — Emergency Department (HOSPITAL_COMMUNITY): Payer: Self-pay

## 2019-05-26 ENCOUNTER — Emergency Department (HOSPITAL_COMMUNITY)
Admission: EM | Admit: 2019-05-26 | Discharge: 2019-05-26 | Disposition: A | Discharge status: Home / Self Care | Payer: Self-pay | Attending: Emergency Medicine | Admitting: Emergency Medicine

## 2019-05-26 ENCOUNTER — Encounter (HOSPITAL_COMMUNITY): Payer: Self-pay | Admitting: Emergency Medicine

## 2019-05-26 DIAGNOSIS — F1210 Cannabis abuse, uncomplicated: Secondary | ICD-10-CM | POA: Insufficient documentation

## 2019-05-26 DIAGNOSIS — B9689 Other specified bacterial agents as the cause of diseases classified elsewhere: Secondary | ICD-10-CM | POA: Insufficient documentation

## 2019-05-26 DIAGNOSIS — R197 Diarrhea, unspecified: Secondary | ICD-10-CM | POA: Insufficient documentation

## 2019-05-26 DIAGNOSIS — N83201 Unspecified ovarian cyst, right side: Secondary | ICD-10-CM | POA: Insufficient documentation

## 2019-05-26 DIAGNOSIS — R112 Nausea with vomiting, unspecified: Secondary | ICD-10-CM

## 2019-05-26 DIAGNOSIS — R1084 Generalized abdominal pain: Secondary | ICD-10-CM | POA: Insufficient documentation

## 2019-05-26 DIAGNOSIS — N760 Acute vaginitis: Secondary | ICD-10-CM | POA: Insufficient documentation

## 2019-05-26 LAB — COMPREHENSIVE METABOLIC PANEL
ALT: 16 U/L (ref 0–44)
AST: 18 U/L (ref 15–41)
Albumin: 5.1 g/dL — ABNORMAL HIGH (ref 3.5–5.0)
Alkaline Phosphatase: 66 U/L (ref 38–126)
Anion gap: 15 (ref 5–15)
BUN: 12 mg/dL (ref 6–20)
CO2: 24 mmol/L (ref 22–32)
Calcium: 9.7 mg/dL (ref 8.9–10.3)
Chloride: 99 mmol/L (ref 98–111)
Creatinine, Ser: 0.88 mg/dL (ref 0.44–1.00)
GFR calc Af Amer: >60 mL/min (ref >60)
GFR calc non Af Amer: >60 mL/min (ref >60)
Glucose, Bld: 121 mg/dL — ABNORMAL HIGH (ref 70–99)
Potassium: 3.4 mmol/L — ABNORMAL LOW (ref 3.5–5.1)
Sodium: 138 mmol/L (ref 135–145)
Total Bilirubin: 0.9 mg/dL (ref 0.3–1.2)
Total Protein: 8.9 g/dL — ABNORMAL HIGH (ref 6.5–8.1)

## 2019-05-26 LAB — WET PREP, GENITAL
Sperm: NONE SEEN
Trich, Wet Prep: NONE SEEN
Yeast Wet Prep HPF POC: NONE SEEN

## 2019-05-26 LAB — CBC
HCT: 45.2 % (ref 36.0–46.0)
Hemoglobin: 15.5 g/dL — ABNORMAL HIGH (ref 12.0–15.0)
MCH: 30.5 pg (ref 26.0–34.0)
MCHC: 34.3 g/dL (ref 30.0–36.0)
MCV: 88.8 fL (ref 80.0–100.0)
Platelets: 414 10*3/uL — ABNORMAL HIGH (ref 150–400)
RBC: 5.09 MIL/uL (ref 3.87–5.11)
RDW: 13.6 % (ref 11.5–15.5)
WBC: 10.7 10*3/uL — ABNORMAL HIGH (ref 4.0–10.5)
nRBC: 0 % (ref 0.0–0.2)

## 2019-05-26 LAB — RAPID URINE DRUG SCREEN, HOSP PERFORMED
Amphetamines: NOT DETECTED
Barbiturates: NOT DETECTED
Benzodiazepines: NOT DETECTED
Cocaine: NOT DETECTED
Opiates: NOT DETECTED
Tetrahydrocannabinol: POSITIVE — AB

## 2019-05-26 LAB — URINALYSIS, ROUTINE W REFLEX MICROSCOPIC
Bilirubin Urine: NEGATIVE
Glucose, UA: NEGATIVE mg/dL
Hgb urine dipstick: NEGATIVE
Ketones, ur: 5 mg/dL — AB
Leukocytes,Ua: NEGATIVE
Nitrite: NEGATIVE
Protein, ur: >=300 mg/dL — AB
Specific Gravity, Urine: 1.025 (ref 1.005–1.030)
pH: 7 (ref 5.0–8.0)

## 2019-05-26 LAB — I-STAT BETA HCG BLOOD, ED (MC, WL, AP ONLY): I-stat hCG, quantitative: <5 m[IU]/mL (ref <5)

## 2019-05-26 LAB — LIPASE, BLOOD: Lipase: 31 U/L (ref 11–51)

## 2019-05-26 MED ORDER — ONDANSETRON 4 MG PO TBDP: 4.0000 mg | ORAL_TABLET | Freq: Three times a day (TID) | ORAL | 0 refills | Status: DC | PRN

## 2019-05-26 MED ORDER — ALUM & MAG HYDROXIDE-SIMETH 200-200-20 MG/5ML PO SUSP
30.0000 mL | Freq: Once | ORAL | Status: AC
  Administered 2019-05-26: 30 mL via ORAL
  Filled 2019-05-26: qty 30

## 2019-05-26 MED ORDER — SODIUM CHLORIDE (PF) 0.9 % IJ SOLN
INTRAMUSCULAR | Status: AC
  Filled 2019-05-26: qty 50

## 2019-05-26 MED ORDER — PROMETHAZINE HCL 25 MG/ML IJ SOLN
25.0000 mg | Freq: Once | INTRAMUSCULAR | Status: AC
  Administered 2019-05-26: 25 mg via INTRAVENOUS
  Filled 2019-05-26: qty 1

## 2019-05-26 MED ORDER — SODIUM CHLORIDE 0.9 % IV BOLUS
1000.0000 mL | Freq: Once | INTRAVENOUS | Status: AC
  Administered 2019-05-26: 1000 mL via INTRAVENOUS

## 2019-05-26 MED ORDER — LIDOCAINE VISCOUS HCL 2 % MT SOLN
15.0000 mL | Freq: Once | OROMUCOSAL | Status: AC
  Administered 2019-05-26: 15 mL via ORAL
  Filled 2019-05-26: qty 15

## 2019-05-26 MED ORDER — ONDANSETRON HCL 4 MG/2ML IJ SOLN
4.0000 mg | Freq: Once | INTRAMUSCULAR | Status: AC | PRN
  Administered 2019-05-26: 4 mg via INTRAVENOUS
  Filled 2019-05-26: qty 2

## 2019-05-26 MED ORDER — METRONIDAZOLE 500 MG PO TABS: 500.0000 mg | ORAL_TABLET | Freq: Two times a day (BID) | ORAL | 0 refills | Status: DC

## 2019-05-26 MED ORDER — IOHEXOL 300 MG/ML  SOLN
100.0000 mL | Freq: Once | INTRAMUSCULAR | Status: AC | PRN
  Administered 2019-05-26: 80 mL via INTRAVENOUS

## 2019-05-26 MED ORDER — FAMOTIDINE IN NACL 20-0.9 MG/50ML-% IV SOLN
20.0000 mg | Freq: Once | INTRAVENOUS | Status: AC
  Administered 2019-05-26: 20 mg via INTRAVENOUS
  Filled 2019-05-26: qty 50

## 2019-05-26 MED ORDER — HALOPERIDOL LACTATE 5 MG/ML IJ SOLN
2.0000 mg | Freq: Once | INTRAMUSCULAR | Status: AC
  Administered 2019-05-26: 2 mg via INTRAVENOUS
  Filled 2019-05-26: qty 1

## 2019-05-26 NOTE — ED Notes (Signed)
US at bedside

## 2019-05-26 NOTE — ED Provider Notes (Signed)
[facility]-EMERGENCY DEPT Provider Note   CSN: [id number] Arrival date & time: 05/26/19  0856    History   Chief Complaint Chief Complaint  Patient presents with  . Emesis    HPI Heather Joyce is a 28 y.o. female with a PMH of chronic GI disturbance presenting with intermittent nausea, vomiting, diarrhea, and abdominal pain onset 5 days ago. Patient reports 6 episodes of non bilious non bloody emesis over the last 24 hours. Patient reports 1 episode of non bloody brown diarrhea over the last 24 hours. Patient describes abdominal pain as non radiating and generalized. Patient reports she has these symptoms every few months. Patient reports she is followed by GI, Dr. Russella DarStark. Patient states nothing makes symptoms better or worse. Patient denies taking any medications prior to arrival since she ran out a few days ago. Patient denies fever, chills, sick contacts, recent travel, cough, congestion, shortness of breath, or chest pain. Patient reports LMP was 1 week ago. Patient denies vaginal bleeding, vaginal discharge, or dysuria. Patient denies any abdominal surgeries.       HPI  Past Medical History:  Diagnosis Date  . 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 SVD (spontaneous vaginal delivery) 07/05/2017  . Shortness of breath   . [redacted] weeks gestation of pregnancy   . Gastroesophageal reflux disease   . Chest pain 01/02/2017  . Hyponatremia 01/02/2017  . Hypokalemia 01/02/2017  . Pregnancy 01/02/2017  . Leukocytosis 01/02/2017    Past Surgical History:  Procedure Laterality Date  . DENTAL SURGERY  2011   wisdom teeth removed     OB History    Gravida  2   Para  1   Term  1   Preterm      AB  1   Living  1     SAB  1   TAB      Ectopic      Multiple  0   Live Births  1            Home Medications    Prior to Admission medications   Medication Sig Start Date End Date Taking? Authorizing  Provider  metroNIDAZOLE (FLAGYL) 500 MG tablet Take 1 tablet (500 mg total) by mouth 2 (two) times daily. 05/26/19   Carlyle BasquesHernandez, Loye Vento P, PA-C  ondansetron (ZOFRAN ODT) 4 MG disintegrating tablet Take 1 tablet (4 mg total) by mouth every 8 (eight) hours as needed for nausea or vomiting. 05/26/19   Carlyle BasquesHernandez, Braylea Brancato P, PA-C  dicyclomine (BENTYL) 10 MG capsule Take 1 capsule (10 mg total) by mouth 3 (three) times daily before meals. Patient not taking: Reported on 05/26/2019 07/25/18 05/26/19  Meryl DareStark, Malcolm T, MD  pantoprazole (PROTONIX) 40 MG tablet Take 1 tablet (40 mg total) by mouth daily. Patient not taking: Reported on 05/26/2019 08/07/18 05/26/19  Meryl DareStark, Malcolm T, MD    Family History Family History  Problem Relation Age of Onset  . Hypertension Father   . Hypertension Paternal Grandfather   . Esophageal cancer Paternal Grandfather   . Colon cancer Neg Hx     Social History Social History   Tobacco Use  . Smoking status: Never Smoker  . Smokeless tobacco: Never Used  Substance Use Topics  . Alcohol use: Yes    Comment: ocassionally  . Drug use: Yes    Types: Marijuana     Allergies   Penicillins, Pineapple, and Food  Review of Systems Review of Systems  Constitutional: Negative for activity change, appetite change, chills, fever and unexpected weight change.  HENT: Negative for congestion, rhinorrhea and sore throat.   Eyes: Negative for visual disturbance.  Respiratory: Negative for cough and shortness of breath.   Cardiovascular: Negative for chest pain.  Gastrointestinal: Positive for abdominal pain, diarrhea, nausea and vomiting. Negative for blood in stool and constipation.  Endocrine: Negative for polydipsia, polyphagia and polyuria.  Genitourinary: Negative for dysuria, flank pain, frequency, vaginal bleeding, vaginal discharge and vaginal pain.  Musculoskeletal: Negative for back pain.  Skin: Negative for rash.  Allergic/Immunologic: Negative for immunocompromised  state.  Neurological: Negative for dizziness, weakness and headaches.  Psychiatric/Behavioral: The patient is not nervous/anxious.      Physical Exam Updated Vital Signs BP 98/86   Pulse 69   Temp 98.4 F (36.9 C) (Oral)   Resp 14   LMP 05/17/2019   SpO2 95%   Physical Exam Vitals signs and nursing note reviewed. Exam conducted with a chaperone present.  Constitutional:      General: She is not in acute distress.    Appearance: She is well-developed. She is not diaphoretic.     Comments: Patient appears uncomfortable and has several episodes of vomiting throughout my exam.   HENT:     Head: Normocephalic and atraumatic.     Mouth/Throat:     Mouth: Mucous membranes are dry.     Pharynx: No oropharyngeal exudate or posterior oropharyngeal erythema.  Eyes:     Extraocular Movements: Extraocular movements intact.     Conjunctiva/sclera: Conjunctivae normal.     Pupils: Pupils are equal, round, and reactive to light.  Neck:     Musculoskeletal: Normal range of motion and neck supple.  Cardiovascular:     Rate and Rhythm: Normal rate and regular rhythm.     Heart sounds: Normal heart sounds. No murmur. No friction rub. No gallop.   Pulmonary:     Effort: Pulmonary effort is normal. No respiratory distress.     Breath sounds: Normal breath sounds. No wheezing or rales.  Abdominal:     General: Abdomen is flat. Bowel sounds are normal. There is no distension.     Palpations: Abdomen is soft. Abdomen is not rigid. There is no mass.     Tenderness: There is generalized abdominal tenderness. There is no right CVA tenderness, left CVA tenderness, guarding or rebound.     Hernia: No hernia is present.  Genitourinary:    Vagina: Vaginal discharge present. No erythema, tenderness or bleeding.     Cervix: Discharge present. No cervical motion tenderness, lesion, erythema or cervical bleeding.     Uterus: Normal.      Adnexa: Right adnexa normal and left adnexa normal.     Comments:  Thin white discharge noted. No CMT.  Musculoskeletal: Normal range of motion.  Skin:    General: Skin is warm.     Findings: No rash.  Neurological:     Mental Status: She is alert and oriented to person, place, and time.     ED Treatments / Results  Labs (all labs ordered are listed, but only abnormal results are displayed) Labs Reviewed  WET PREP, GENITAL - Abnormal; Notable for the following components:      Result Value   Clue Cells Wet Prep HPF POC PRESENT (*)    WBC, Wet Prep HPF POC MANY (*)    All other components within normal limits  COMPREHENSIVE METABOLIC PANEL -  Abnormal; Notable for the following components:   Potassium 3.4 (*)    Glucose, Bld 121 (*)    Total Protein 8.9 (*)    Albumin 5.1 (*)    All other components within normal limits  CBC - Abnormal; Notable for the following components:   WBC 10.7 (*)    Hemoglobin 15.5 (*)    Platelets 414 (*)    All other components within normal limits  URINALYSIS, ROUTINE W REFLEX MICROSCOPIC - Abnormal; Notable for the following components:   Color, Urine AMBER (*)    APPearance HAZY (*)    Ketones, ur 5 (*)    Protein, ur >=300 (*)    Bacteria, UA FEW (*)    All other components within normal limits  RAPID URINE DRUG SCREEN, HOSP PERFORMED - Abnormal; Notable for the following components:   Tetrahydrocannabinol POSITIVE (*)    All other components within normal limits  URINE CULTURE  LIPASE, BLOOD  I-STAT BETA HCG BLOOD, ED ([facility], [facility], [facility] ONLY)  GC/CHLAMYDIA PROBE AMP ([place]) NOT AT [facility]    EKG None  Radiology US Transvaginal Non-ob  Result Date: 05/26/2019 CLINICAL DATA:  Patient with right-sided abdominal pain. EXAM: TRANSABDOMINAL AND TRANSVAGINAL ULTRASOUND OF PELVIS DOPPLER ULTRASOUND OF OVARIES TECHNIQUE: Both transabdominal and transvaginal ultrasound examinations of the pelvis were performed. Transabdominal technique was performed for global imaging of the pelvis including uterus, ovaries,  adnexal regions, and pelvic cul-de-sac. It was necessary to proceed with endovaginal exam following the transabdominal exam to visualize the adnexal structures. Color and duplex Doppler ultrasound was utilized to evaluate blood flow to the ovaries. COMPARISON:  None. FINDINGS: Uterus Measurements: 9.3 x 3.5 x 5.4 cm = volume: 91 mL. No fibroids or other mass visualized. Endometrium Thickness: 9 mm.  No focal abnormality visualized. Right ovary Measurements: 0.8 x 2.6 x 3.0 cm = volume: 15.2 mL. Within the right ovary there is a 2.6 x 2.0 x 2.5 cm complex cyst with internal echogenicity. Left ovary Measurements: 3.6 x 2.4 x 2.1 cm = volume: 9.6 mL. Normal appearance/no adnexal mass. There is a 2.0 cm cyst within the left ovary. Pulsed Doppler evaluation of both ovaries demonstrates normal low-resistance arterial and venous waveforms. Other findings No abnormal free fluid. IMPRESSION: No sonographic evidence to suggest torsion. Probable hemorrhagic cyst within the right ovary. Short-interval follow up ultrasound in 6-12 weeks is recommended, preferably during the week following the patient's normal menses. Electronically Signed   By: Lovey Newcomer M.D.   On: 05/26/2019 15:39   US Pelvis Complete  Result Date: 05/26/2019 CLINICAL DATA:  Patient with right-sided abdominal pain. EXAM: TRANSABDOMINAL AND TRANSVAGINAL ULTRASOUND OF PELVIS DOPPLER ULTRASOUND OF OVARIES TECHNIQUE: Both transabdominal and transvaginal ultrasound examinations of the pelvis were performed. Transabdominal technique was performed for global imaging of the pelvis including uterus, ovaries, adnexal regions, and pelvic cul-de-sac. It was necessary to proceed with endovaginal exam following the transabdominal exam to visualize the adnexal structures. Color and duplex Doppler ultrasound was utilized to evaluate blood flow to the ovaries. COMPARISON:  None. FINDINGS: Uterus Measurements: 9.3 x 3.5 x 5.4 cm = volume: 91 mL. No fibroids or other mass  visualized. Endometrium Thickness: 9 mm.  No focal abnormality visualized. Right ovary Measurements: 0.8 x 2.6 x 3.0 cm = volume: 15.2 mL. Within the right ovary there is a 2.6 x 2.0 x 2.5 cm complex cyst with internal echogenicity. Left ovary Measurements: 3.6 x 2.4 x 2.1 cm = volume: 9.6 mL. Normal appearance/no adnexal  mass. There is a 2.0 cm cyst within the left ovary. Pulsed Doppler evaluation of both ovaries demonstrates normal low-resistance arterial and venous waveforms. Other findings No abnormal free fluid. IMPRESSION: No sonographic evidence to suggest torsion. Probable hemorrhagic cyst within the right ovary. Short-interval follow up ultrasound in 6-12 weeks is recommended, preferably during the week following the patient's normal menses. Electronically Signed   By: Annia Beltrew  Davis M.D.   On: 05/26/2019 15:39   Ct Abdomen Pelvis W Contrast  Result Date: 05/26/2019 CLINICAL DATA:  "Per patient, has been vomiting since Thursday-states abdominal pain, diarrhea" LEFT UPPER QUADRANT pains per pt. EXAM: CT ABDOMEN AND PELVIS WITH CONTRAST TECHNIQUE: Multidetector CT imaging of the abdomen and pelvis was performed using the standard protocol following bolus administration of intravenous contrast. CONTRAST:  80mL OMNIPAQUE IOHEXOL 300 MG/ML  SOLN COMPARISON:  03/23/2018 FINDINGS: Lower chest: No acute abnormality. Hepatobiliary: No focal liver abnormality is seen. No radiopaque gallstones, biliary dilatation, or pericholecystic inflammatory changes. Pancreas: Unremarkable. No pancreatic ductal dilatation or surrounding inflammatory changes. Spleen: Normal in size without focal abnormality. Adrenals/Urinary Tract: Adrenal glands are unremarkable. Kidneys are normal, without renal calculi, focal lesion, or hydronephrosis. The bladder is decompressed. Stomach/Bowel: Stomach is normal in appearance. Small bowel loops are unremarkable. Loops of colon are unremarkable. The appendix is well seen and has a normal  appearance. Vascular/Lymphatic: No significant vascular findings are present. No enlarged abdominal or pelvic lymph nodes. Reproductive: Uterus is present. RIGHT adnexal low-attenuation mass is 2.4 centimeters. Smaller low-attenuation lesions are identified MEDIAL to this mass, possibly representing ovarian follicles or fluid within the fallopian tube. LEFT adnexal low-attenuation mass is 2.5 centimeters. No free pelvic fluid. Other: Anterior abdominal wall is unremarkable. Musculoskeletal: No acute or significant osseous findings. IMPRESSION: 1. Normal appendix. 2. Bilateral adnexal cysts. Question of RIGHT hydrosalpinx versus additional ovarian cysts/follicles. Consider further evaluation with pelvic ultrasound for characterization. 3. No bowel obstruction. Electronically Signed   By: Norva PavlovElizabeth  Brown M.D.   On: 05/26/2019 12:22   [place] Art/ven Flow Abd Pelv Doppler  Result Date: 05/26/2019 CLINICAL DATA:  Patient with right-sided abdominal pain. EXAM: TRANSABDOMINAL AND TRANSVAGINAL ULTRASOUND OF PELVIS DOPPLER ULTRASOUND OF OVARIES TECHNIQUE: Both transabdominal and transvaginal ultrasound examinations of the pelvis were performed. Transabdominal technique was performed for global imaging of the pelvis including uterus, ovaries, adnexal regions, and pelvic cul-de-sac. It was necessary to proceed with endovaginal exam following the transabdominal exam to visualize the adnexal structures. Color and duplex Doppler ultrasound was utilized to evaluate blood flow to the ovaries. COMPARISON:  None. FINDINGS: Uterus Measurements: 9.3 x 3.5 x 5.4 cm = volume: 91 mL. No fibroids or other mass visualized. Endometrium Thickness: 9 mm.  No focal abnormality visualized. Right ovary Measurements: 0.8 x 2.6 x 3.0 cm = volume: 15.2 mL. Within the right ovary there is a 2.6 x 2.0 x 2.5 cm complex cyst with internal echogenicity. Left ovary Measurements: 3.6 x 2.4 x 2.1 cm = volume: 9.6 mL. Normal appearance/no adnexal mass.  There is a 2.0 cm cyst within the left ovary. Pulsed Doppler evaluation of both ovaries demonstrates normal low-resistance arterial and venous waveforms. Other findings No abnormal free fluid. IMPRESSION: No sonographic evidence to suggest torsion. Probable hemorrhagic cyst within the right ovary. Short-interval follow up ultrasound in 6-12 weeks is recommended, preferably during the week following the patient's normal menses. Electronically Signed   By: Annia Beltrew  Davis M.D.   On: 05/26/2019 15:39    Procedures Procedures (including critical care time)  Medications  Ordered in ED Medications  sodium chloride (PF) 0.9 % injection (has no administration in time range)  ondansetron (ZOFRAN) injection 4 mg (4 mg Intravenous Given 05/26/19 0958)  sodium chloride 0.9 % bolus 1,000 mL (0 mLs Intravenous Stopped 05/26/19 1103)  promethazine (PHENERGAN) injection 25 mg (25 mg Intravenous Given 05/26/19 1059)  sodium chloride 0.9 % bolus 1,000 mL (0 mLs Intravenous Stopped 05/26/19 1324)  alum & mag hydroxide-simeth (MAALOX/MYLANTA) 200-200-20 MG/5ML suspension 30 mL (30 mLs Oral Given 05/26/19 1154)    And  lidocaine (XYLOCAINE) 2 % viscous mouth solution 15 mL (15 mLs Oral Given 05/26/19 1154)  iohexol (OMNIPAQUE) 300 MG/ML solution 100 mL (80 mLs Intravenous Contrast Given 05/26/19 1202)  famotidine (PEPCID) IVPB 20 mg premix (0 mg Intravenous Stopped 05/26/19 1425)  haloperidol lactate (HALDOL) injection 2 mg (2 mg Intravenous Given 05/26/19 1320)     Initial Impression / Assessment and Plan / ED Course  I have reviewed the triage vital signs and the nursing notes.  Pertinent labs & imaging results that were available during my care of the patient were reviewed by me and considered in my medical decision making (see chart for details).  Clinical Course as of May 25 1553  Mon May 26, 2019  1043 Leukocytosis noted at 10.7.  WBC(!): 10.7 [AH]  1212 Clue cells and many WBCs present.  Clue Cells Wet Prep HPF  POC(!): PRESENT [AH]  1222 Reassessed patient. Patient reports nausea, but states vomiting has significantly improved.    [AH]  1227 1. Normal appendix. 2. Bilateral adnexal cysts. Question of RIGHT hydrosalpinx versus additional ovarian cysts/follicles. Consider further evaluation with pelvic ultrasound for characterization. 3. No bowel obstruction.    CT Abdomen Pelvis W Contrast [AH]  1329 UA reveals WBCs and few bacteria.   WBC, UA: 11-20 [AH]  1409 UDS positive for THC.  Tetrahydrocannabinol(!): POSITIVE [AH]  1449 Reassessed patient. Patient reports symptoms have significantly improved.    [AH]  1542 No sonographic evidence to suggest torsion. Probable hemorrhagic cyst within the right ovary. Short-interval follow up ultrasound in 6-12 weeks is recommended, preferably during the week following the patient's normal menses.    US Pelvis Complete [AH]    Clinical Course User Index [AH] Leretha Dykes, PA-C      Patient presents with abdominal pain, nausea, vomiting, and diarrhea. Labs, vitals, and imaging reviewed. CT abdomen reveals bilateral adnexal cysts and right hydrosalpinx vs ovarian cyst/follicles. Pelvic ultrasound was recommended. Pelvic ultrasound reveals probably hemorrhagic cyst within right ovary without ovarian torsion. Provided IVF, pepcid, GI cocktail, and antiemetics in the ER. Symptoms have completely resolved while in the ER. Advised patient to stop using marijuana as it can be contributing to her symptoms. Patient is stable in no acute distress. Discussed strict return precautions. Advised patient to follow up with OBGYN, GI, and PCP. Patient states she understands and agrees with plan.   Final Clinical Impressions(s) / ED Diagnoses   Final diagnoses:  Generalized abdominal pain  Nausea vomiting and diarrhea  Bacterial vaginosis  Cyst of right ovary    ED Discharge Orders         Ordered    ondansetron (ZOFRAN ODT) 4 MG disintegrating tablet  Every  8 hours PRN     05/26/19 1549    metroNIDAZOLE (FLAGYL) 500 MG tablet  2 times daily     05/26/19 1549           Carlyle Basques [place], [place] 05/26/19 1554    Pfeiffer,  Lebron ConnersMarcy, MD 05/31/19 1100

## 2019-05-26 NOTE — ED Notes (Signed)
Pt ambulatory to bathroom

## 2019-05-26 NOTE — ED Notes (Signed)
Pt reporting continuing nausea/emesis after Zofran administration.  Requesting other medications.  PA Ana made aware.

## 2019-05-26 NOTE — ED Notes (Signed)
Pt aware that we need a urine sample.

## 2019-05-26 NOTE — ED Triage Notes (Signed)
Per patient, has been vomiting since Thursday-states abdominal pain, diarrhea

## 2019-05-26 NOTE — ED Notes (Signed)
Patient transported to CT 

## 2019-05-26 NOTE — ED Notes (Signed)
ED Provider at bedside. 

## 2019-05-26 NOTE — Discharge Instructions (Addendum)
You have been seen today for nausea, vomiting, diarrhea, and abdominal pain. Please read and follow all provided instructions.   1. Medications: zofran (nausea and vomiting), metronidazole (for bacterial vaginosis), usual home medications 2. Treatment: rest, drink plenty of fluids, advance diet as tolerated 3. Follow Up: Please follow up with OBGYN in 6-12 weeks for a follow up ultrasound for your ovarian cyst. Please follow up with your primary doctor in 2 days for discussion of your diagnoses and further evaluation after today's visit; if you do not have a primary care doctor use the resource guide provided to find one; Please return to the ER for any new or worsening symptoms. Please obtain all of your results from medical records or have your doctors office obtain the results - share them with your doctor - you should be seen at your doctors office. Call today to arrange your follow up.   Take medications as prescribed. Please review all of the medicines and only take them if you do not have an allergy to them. Return to the emergency room for worsening condition or new concerning symptoms. Follow up with your regular doctor. If you don't have a regular doctor use one of the numbers below to establish a primary care doctor.  Please be aware that if you are taking birth control pills, taking other prescriptions, ESPECIALLY ANTIBIOTICS may make the birth control ineffective - if this is the case, either do not engage in sexual activity or use alternative methods of birth control such as condoms until you have finished the medicine and your family doctor says it is OK to restart them. If you are on a blood thinner such as COUMADIN, be aware that any other medicine that you take may cause the coumadin to either work too much, or not enough - you should have your coumadin level rechecked in next 7 days if this is the case.  ?  It is also a possibility that you have an allergic reaction to any of the  medicines that you have been prescribed - Everybody reacts differently to medications and while MOST people have no trouble with most medicines, you may have a reaction such as nausea, vomiting, rash, swelling, shortness of breath. If this is the case, please stop taking the medicine immediately and contact your physician.  ?  You should return to the ER if you develop severe or worsening symptoms.   Emergency Department Resource Guide 1) Find a Doctor and Pay Out of Pocket Although you won't have to find out who is covered by your insurance plan, it is a good idea to ask around and get recommendations. You will then need to call the office and see if the doctor you have chosen will accept you as a new patient and what types of options they offer for patients who are self-pay. Some doctors offer discounts or will set up payment plans for their patients who do not have insurance, but you will need to ask so you aren't surprised when you get to your appointment.  2) Contact Your Local Health Department Not all health departments have doctors that can see patients for sick visits, but many do, so it is worth a call to see if yours does. If you don't know where your local health department is, you can check in your phone book. The CDC also has a tool to help you locate your state's health department, and many state websites also have listings of all of their local health departments.  3) Find a [facility] If your illness is not likely to be very severe or complicated, you may want to try a walk in clinic. These are popping up all over the country in pharmacies, drugstores, and shopping centers. They're usually staffed by nurse practitioners or physician assistants that have been trained to treat common illnesses and complaints. They're usually fairly quick and inexpensive. However, if you have serious medical issues or chronic medical problems, these are probably not your best option.  No Primary Care  Doctor: Call [facility] at  [phone] - they can help you locate a primary care doctor that  accepts your insurance, provides certain services, etc. Physician Referral Service- [phone]  Chronic Pain Problems: Organization         Address  Phone   Notes  [facility]  [phone] Patients need to be referred by their primary care doctor.   Medication Assistance: Organization         Address  Phone   Notes  [facility] Medication [facility] [address] [place], [place] [address] [phone] --Must be a resident of [facility] -- Must have NO insurance coverage whatsoever (no Medicaid/ Medicare, etc.) -- The pt. MUST have a primary care doctor that directs their care regularly and follows them in the community   [facility]  [phone]   [employer]  [phone]    Agencies that provide inexpensive medical care: Organization         Address  Phone   Notes  [place]  [phone]   [facility]    [phone]   [facility] [place], [place] [address] [phone]   [place] [address] [place] [phone]   [facility]    [phone]   [facility]    [phone]   [place] [address], [employer] [place] Phone:  [phone], Fax:  [phone] Hours of Operation:  9 am - 6 pm, M-F.  Also accepts Medicaid/Medicare and self-pay.  [facility] [place], [address], [place] Phone: [phone], Fax: [phone]. Hours of Operation:  8:30 am - 5:30 pm, M-F.  Also accepts Medicaid and self-pay.  [facility] [place] [address], [place] Phone: [phone]   [place], [place], [place] [phone], Ext. [phone] Mondays & Thursdays: 7-9 AM.  First 15 patients are seen on a first come,  first serve basis.    [place] Providers:  Organization         Address  Phone   Notes  [facility] [address],  [phone] Also accepts self-pay patients.  [facility] [address], [place]  [phone]   [place], [address], [place] [phone]   [facility] [address] [place] [phone]   Lucianne Lei [address], [place]   [phone] Only accepts [place] [facility] [place] patients after they have their name applied to their card.   Self-Pay (no insurance) in [facility]:  Organization         Address  Phone   Notes  [facility], [facility] [place]  [place], [place] [phone]   [facility] [place] [phone]   [facility] [place]  [place], [place], [place] [phone]   [facility]/Dr. Osei-Bonsu  [address], [place] or [address], [place] [phone] Phone number for both [place] and [place] locations is the same.  [facility] and [facility] [address], [place] [phone]   [facility] [address] [place] or [address] [phone]   [facility] [address], [place] [phone], phone; [phone], fax Sees patients 1st and 3rd Saturday of every month.  Must not qualify for public or private insurance (i.e. Medicaid, Medicare, [place] Health Choice, Veterans' Benefits)  Household income should be no more than 200% of the poverty level The clinic cannot treat you if you are pregnant or think you are pregnant  Sexually transmitted diseases are not treated at the clinic.

## 2019-05-27 LAB — GC/CHLAMYDIA PROBE AMP (~~LOC~~) NOT AT ARMC
Chlamydia: NEGATIVE
Neisseria Gonorrhea: NEGATIVE

## 2019-05-27 LAB — URINE CULTURE: Culture: NO GROWTH

## 2019-07-30 ENCOUNTER — Encounter (HOSPITAL_COMMUNITY): Payer: Self-pay

## 2019-07-30 ENCOUNTER — Other Ambulatory Visit: Payer: Self-pay

## 2019-07-30 ENCOUNTER — Ambulatory Visit (HOSPITAL_COMMUNITY)
Admission: EM | Admit: 2019-07-30 | Discharge: 2019-07-30 | Disposition: A | Discharge status: Home / Self Care | Payer: Self-pay | Attending: Emergency Medicine | Admitting: Emergency Medicine

## 2019-07-30 DIAGNOSIS — Z880 Allergy status to penicillin: Secondary | ICD-10-CM | POA: Insufficient documentation

## 2019-07-30 DIAGNOSIS — Z91018 Allergy to other foods: Secondary | ICD-10-CM | POA: Insufficient documentation

## 2019-07-30 DIAGNOSIS — Z3202 Encounter for pregnancy test, result negative: Secondary | ICD-10-CM

## 2019-07-30 DIAGNOSIS — R197 Diarrhea, unspecified: Secondary | ICD-10-CM | POA: Insufficient documentation

## 2019-07-30 DIAGNOSIS — Z800 Family history of malignant neoplasm of digestive organs: Secondary | ICD-10-CM | POA: Insufficient documentation

## 2019-07-30 DIAGNOSIS — Z20828 Contact with and (suspected) exposure to other viral communicable diseases: Secondary | ICD-10-CM | POA: Insufficient documentation

## 2019-07-30 DIAGNOSIS — R1084 Generalized abdominal pain: Secondary | ICD-10-CM | POA: Insufficient documentation

## 2019-07-30 DIAGNOSIS — R10817 Generalized abdominal tenderness: Secondary | ICD-10-CM

## 2019-07-30 DIAGNOSIS — R112 Nausea with vomiting, unspecified: Secondary | ICD-10-CM | POA: Insufficient documentation

## 2019-07-30 LAB — POCT URINALYSIS DIP (DEVICE)
Glucose, UA: NEGATIVE mg/dL
Ketones, ur: 15 mg/dL — AB
Leukocytes,Ua: NEGATIVE
Nitrite: NEGATIVE
Protein, ur: >=300 mg/dL — AB
Specific Gravity, Urine: >=1.03 (ref 1.005–1.030)
Urobilinogen, UA: 0.2 mg/dL (ref 0.0–1.0)
pH: 6.5 (ref 5.0–8.0)

## 2019-07-30 LAB — CBC WITH DIFFERENTIAL/PLATELET
Abs Immature Granulocytes: 0.07 10*3/uL (ref 0.00–0.07)
Basophils Absolute: 0 10*3/uL (ref 0.0–0.1)
Basophils Relative: 0 %
Eosinophils Absolute: 0 10*3/uL (ref 0.0–0.5)
Eosinophils Relative: 0 %
HCT: 38.5 % (ref 36.0–46.0)
Hemoglobin: 13.4 g/dL (ref 12.0–15.0)
Immature Granulocytes: 1 %
Lymphocytes Relative: 10 %
Lymphs Abs: 1.2 10*3/uL (ref 0.7–4.0)
MCH: 30.8 pg (ref 26.0–34.0)
MCHC: 34.8 g/dL (ref 30.0–36.0)
MCV: 88.5 fL (ref 80.0–100.0)
Monocytes Absolute: 0.5 10*3/uL (ref 0.1–1.0)
Monocytes Relative: 4 %
Neutro Abs: 10.4 10*3/uL — ABNORMAL HIGH (ref 1.7–7.7)
Neutrophils Relative %: 85 %
Platelets: 364 10*3/uL (ref 150–400)
RBC: 4.35 MIL/uL (ref 3.87–5.11)
RDW: 13.3 % (ref 11.5–15.5)
WBC: 12.2 10*3/uL — ABNORMAL HIGH (ref 4.0–10.5)
nRBC: 0 % (ref 0.0–0.2)

## 2019-07-30 LAB — COMPREHENSIVE METABOLIC PANEL
ALT: 16 U/L (ref 0–44)
AST: 20 U/L (ref 15–41)
Albumin: 4.6 g/dL (ref 3.5–5.0)
Alkaline Phosphatase: 50 U/L (ref 38–126)
Anion gap: 13 (ref 5–15)
BUN: 9 mg/dL (ref 6–20)
CO2: 24 mmol/L (ref 22–32)
Calcium: 10 mg/dL (ref 8.9–10.3)
Chloride: 105 mmol/L (ref 98–111)
Creatinine, Ser: 0.81 mg/dL (ref 0.44–1.00)
GFR calc Af Amer: >60 mL/min (ref >60)
GFR calc non Af Amer: >60 mL/min (ref >60)
Glucose, Bld: 117 mg/dL — ABNORMAL HIGH (ref 70–99)
Potassium: 3.6 mmol/L (ref 3.5–5.1)
Sodium: 142 mmol/L (ref 135–145)
Total Bilirubin: 1.1 mg/dL (ref 0.3–1.2)
Total Protein: 7.9 g/dL (ref 6.5–8.1)

## 2019-07-30 LAB — POCT PREGNANCY, URINE: Preg Test, Ur: NEGATIVE

## 2019-07-30 MED ORDER — ONDANSETRON 4 MG PO TBDP
4.0000 mg | ORAL_TABLET | Freq: Once | ORAL | Status: AC
  Administered 2019-07-30: 4 mg via ORAL

## 2019-07-30 MED ORDER — METOCLOPRAMIDE HCL 5 MG/ML IJ SOLN
10.0000 mg | Freq: Once | INTRAMUSCULAR | Status: AC
  Administered 2019-07-30: 10 mg via INTRAMUSCULAR

## 2019-07-30 MED ORDER — ONDANSETRON 4 MG PO TBDP: 4.0000 mg | ORAL_TABLET | Freq: Three times a day (TID) | ORAL | 0 refills | Status: DC | PRN

## 2019-07-30 MED ORDER — METOCLOPRAMIDE HCL 5 MG/ML IJ SOLN
INTRAMUSCULAR | Status: AC
  Filled 2019-07-30: qty 2

## 2019-07-30 MED ORDER — PROMETHAZINE HCL 25 MG PO TABS: 25.0000 mg | ORAL_TABLET | Freq: Three times a day (TID) | ORAL | 0 refills | Status: DC | PRN

## 2019-07-30 MED ORDER — ONDANSETRON 4 MG PO TBDP
ORAL_TABLET | ORAL | Status: AC
  Filled 2019-07-30: qty 1

## 2019-07-30 NOTE — ED Triage Notes (Signed)
Patient report she is having abdominal pain, diarrhea and vomiting x 1 day.

## 2019-07-30 NOTE — Discharge Instructions (Signed)
Warm showers, use of nausea medication at directed. Phenergan can cause drowsiness, may take half a tab if needed.  Small frequent sips of fluids- Pedialyte, Gatorade, water, broth- to maintain hydration.   Rest.  Continue to avoid use of Marijuana as it can trigger these episodes and will be helpful for evaluation in the future if this is not a variable to be considered.  Please continue to follow with gastroenterology for persistent symptoms.  We will notify you if there are any additional needs found on your blood testing.  Please go to the Er for any worsening of symptoms.

## 2019-07-30 NOTE — ED Provider Notes (Signed)
[facility]    CSN: [id number] Arrival date & time: 07/30/19  [id number]      History   Chief Complaint Chief Complaint  Patient presents with  . Abdominal Pain  . Emesis  . Diarrhea    HPI Heather Joyce is a 28 y.o. female.   Heather Joyce presents with complaints of nausea vomiting and diarrhea which started yesterday evening. Generalized abdominal pain. No fevers. No known ill contacts. Some dark possibly black in vomiting. No blood or black in stool. Has had multiple episodes of diarrhea. Decreased urination but able to provide urine specimen on arrival to uc today. She feels the same as previous episodes of similar. Occurs every few months, has been seen here in UC as well as in the ER for similar. Has tested positive for marijuana in the past. She states she has not used any marijuana in the past month. States last similar episode was approximately 2 months ago. Takes omeprazole daily. Out of any antiemetics which typically help. Has followed with Dr. Russella DarStark with GI, in the past. She states she would like to switch GI, however. Has been sipping water. Often vomits this up later, however. History of gerd, hyponatremia and hypokalemia.     ROS per HPI, negative if not otherwise mentioned.      Past Medical History:  Diagnosis Date  . 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 SVD (spontaneous vaginal delivery) 07/05/2017  . Shortness of breath   . [redacted] weeks gestation of pregnancy   . Gastroesophageal reflux disease   . Chest pain 01/02/2017  . Hyponatremia 01/02/2017  . Hypokalemia 01/02/2017  . Pregnancy 01/02/2017  . Leukocytosis 01/02/2017    Past Surgical History:  Procedure Laterality Date  . DENTAL SURGERY  2011   wisdom teeth removed    OB History    Gravida  2   Para  1   Term  1   Preterm      AB  1   Living  1     SAB  1   TAB      Ectopic      Multiple  0   Live Births  1             Home Medications    Prior to Admission medications   Medication Sig Start Date End Date Taking? Authorizing Provider  ondansetron (ZOFRAN ODT) 4 MG disintegrating tablet Take 1 tablet (4 mg total) by mouth every 8 (eight) hours as needed for nausea or vomiting. 07/30/19   Georgetta HaberBurky, Swara Donze B, NP  promethazine (PHENERGAN) 25 MG tablet Take 1 tablet (25 mg total) by mouth every 8 (eight) hours as needed for nausea, vomiting or refractory nausea / vomiting. 07/30/19   Georgetta HaberBurky, Shanikka Wonders B, NP  dicyclomine (BENTYL) 10 MG capsule Take 1 capsule (10 mg total) by mouth 3 (three) times daily before meals. Patient not taking: Reported on 05/26/2019 07/25/18 05/26/19  Meryl DareStark, Malcolm T, MD  pantoprazole (PROTONIX) 40 MG tablet Take 1 tablet (40 mg total) by mouth daily. Patient not taking: Reported on 05/26/2019 08/07/18 05/26/19  Meryl DareStark, Malcolm T, MD    Family History Family History  Problem Relation Age of Onset  . Hypertension Father   . Hypertension Paternal Grandfather   . Esophageal cancer Paternal Grandfather   . Colon cancer Neg Hx     Social History Social History   Tobacco Use  . Smoking  status: Never Smoker  . Smokeless tobacco: Never Used  Substance Use Topics  . Alcohol use: Yes    Comment: ocassionally  . Drug use: Yes    Types: Marijuana     Allergies   Penicillins, Pineapple, and Food   Review of Systems Review of Systems   Physical Exam Triage Vital Signs ED Triage Vitals  Enc Vitals Group     BP 07/30/19 0843 121/77     Pulse Rate 07/30/19 0843 68     Resp 07/30/19 0843 16     Temp 07/30/19 0843 98.3 F (36.8 C)     Temp Source 07/30/19 0843 Temporal     SpO2 07/30/19 0843 100 %     Weight --      Height --      Head Circumference --      Peak Flow --      Pain Score 07/30/19 0842 7     Pain Loc --      Pain Edu? --      Excl. in GC? --    No data found.  Updated Vital Signs BP 121/77 (BP Location: Right Arm)   Pulse 68   Temp 98.3 F (36.8 C)  (Temporal)   Resp 16   SpO2 100%    Physical Exam Constitutional:      General: She is not in acute distress.    Appearance: She is well-developed.  Cardiovascular:     Rate and Rhythm: Normal rate.     Heart sounds: Normal heart sounds.  Pulmonary:     Effort: Pulmonary effort is normal.  Abdominal:     General: Abdomen is flat.     Palpations: Abdomen is soft.     Tenderness: There is generalized abdominal tenderness.     Comments: Vomiting on arrival to clinic; during history  Patient without vomiting, endorses mild nausea; sitting upright without difficulty, ambulatory without difficulty; no acute abdominal findings on exam   Skin:    General: Skin is warm and dry.  Neurological:     Mental Status: She is alert and oriented to person, place, and time.      UC Treatments / Results  Labs (all labs ordered are listed, but only abnormal results are displayed) Labs Reviewed  POCT URINALYSIS DIP (DEVICE) - Abnormal; Notable for the following components:      Result Value   Bilirubin Urine SMALL (*)    Ketones, ur 15 (*)    Hgb urine dipstick LARGE (*)    Protein, ur >=300 (*)    All other components within normal limits  NOVEL CORONAVIRUS, NAA (HOSP ORDER, SEND-OUT TO REF LAB; TAT 18-24 HRS)  CBC WITH DIFFERENTIAL/PLATELET  COMPREHENSIVE METABOLIC PANEL  POCT PREGNANCY, URINE    EKG   Radiology No results found.  Procedures Procedures (including critical care time)  Medications Ordered in UC Medications  metoCLOPramide (REGLAN) injection 10 mg (has no administration in time range)  ondansetron (ZOFRAN-ODT) disintegrating tablet 4 mg (4 mg Oral Given 07/30/19 0853)  ondansetron (ZOFRAN-ODT) 4 MG disintegrating tablet (has no administration in time range)    Initial Impression / Assessment and Plan / UC Course  I have reviewed the triage vital signs and the nursing notes.  Pertinent labs & imaging results that were available during my care of the patient were  reviewed by me and considered in my medical decision making (see chart for details).     Urine with ketones (15) as well as elevated specific  gravity. Has been taking some sips. This is unfortunately a recurrent issue for this patient. <24 hours of symptoms. Vitals stable here today. Afebrile. Non toxic appearing. After zofran administered no further vomiting. IM reglan additionally provided here in clinic with script for zofran as well as prn phenergan. Encouraged continue to avoid use of marijuana to eliminate this risk factor. Encouraged to continue to follow with GI for evaluation and management. CBC and CMP pending. Will notify of any positive findings and if any changes to treatment are needed.  Return precautions provided. Patient verbalized understanding and agreeable to plan.   Final Clinical Impressions(s) / UC Diagnoses   Final diagnoses:  Nausea vomiting and diarrhea     Discharge Instructions     Warm showers, use of nausea medication at directed. Phenergan can cause drowsiness, may take half a tab if needed.  Small frequent sips of fluids- Pedialyte, Gatorade, water, broth- to maintain hydration.   Rest.  Continue to avoid use of Marijuana as it can trigger these episodes and will be helpful for evaluation in the future if this is not a variable to be considered.  Please continue to follow with gastroenterology for persistent symptoms.  We will notify you if there are any additional needs found on your blood testing.  Please go to the Er for any worsening of symptoms.    ED Prescriptions    Medication Sig Dispense Auth. Provider   ondansetron (ZOFRAN ODT) 4 MG disintegrating tablet Take 1 tablet (4 mg total) by mouth every 8 (eight) hours as needed for nausea or vomiting. 20 tablet Augusto Gamble B, NP   promethazine (PHENERGAN) 25 MG tablet Take 1 tablet (25 mg total) by mouth every 8 (eight) hours as needed for nausea, vomiting or refractory nausea / vomiting. 15 tablet  Zigmund Gottron, NP     PDMP not reviewed this encounter.   Zigmund Gottron, NP 07/30/19 [phone]

## 2019-08-01 ENCOUNTER — Telehealth (HOSPITAL_COMMUNITY): Payer: Self-pay | Admitting: Emergency Medicine

## 2019-08-01 LAB — NOVEL CORONAVIRUS, NAA (HOSP ORDER, SEND-OUT TO REF LAB; TAT 18-24 HRS): SARS-CoV-2, NAA: NOT DETECTED

## 2019-08-01 NOTE — Telephone Encounter (Signed)
Per Lanelle Bal, interestingly it does look like she may have infection somewhere, but that was not obvious yesterday. hopefully her symptoms are better (she has cyclical vomiting). if she gets fevers, pain, or any new or persistent symptoms then she will need to go to the Er for work up since her WBC and neutrophil count was up some. BMP looks good  Attempted to reach patient. No answer at this time. Call cannot be completed. Will try once more Monday.

## 2019-08-04 ENCOUNTER — Telehealth (HOSPITAL_COMMUNITY): Payer: Self-pay | Admitting: Emergency Medicine

## 2019-08-04 NOTE — Telephone Encounter (Signed)
Attempted to reach patient x2. No answer at this time. Call cannot be completed.   

## 2019-10-09 IMAGING — CT CT ABDOMEN AND PELVIS WITH CONTRAST
2 of 5 series · 16 of 46 positions shown, 18 images · IV contrast (OMNIPAQUE)
Comparison: 03/23/2018

CLINICAL DATA: "Per patient, has been vomiting since
[REDACTED]-states abdominal pain, diarrhea" LEFT UPPER QUADRANT pains
per pt.

EXAM:
CT ABDOMEN AND PELVIS WITH CONTRAST
TECHNIQUE: Multidetector CT imaging of the abdomen and pelvis was performed
using the standard protocol following bolus administration of
intravenous contrast.
CONTRAST:  80mL OMNIPAQUE IOHEXOL 300 MG/ML  SOLN

[Series 3: thins · axial · 0.71mm/px · z∈[-643,-272]mm · 13 of 576 slices shown, 15 images]
[im 24/576  soft-tissue]
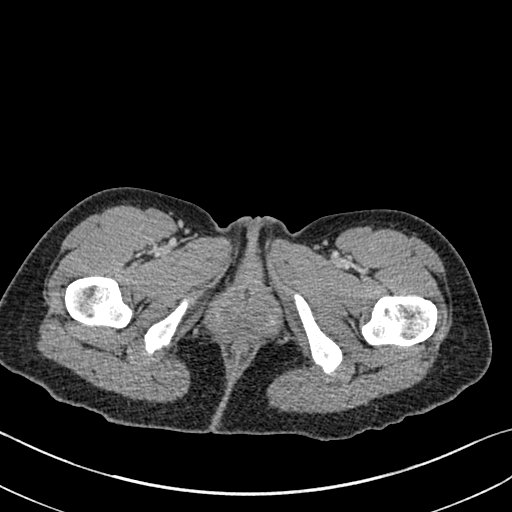
[im 24/576  bone]
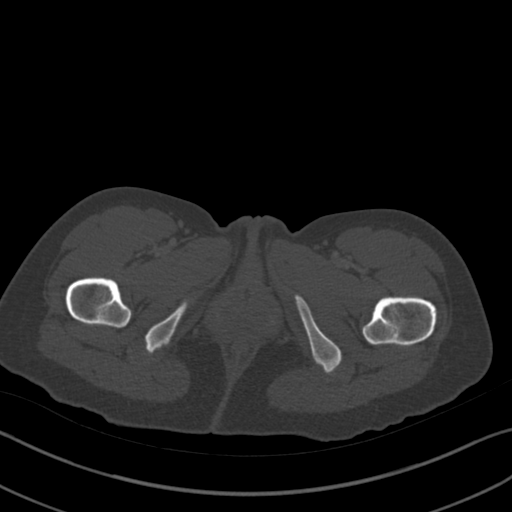
[im 70/576  soft-tissue]
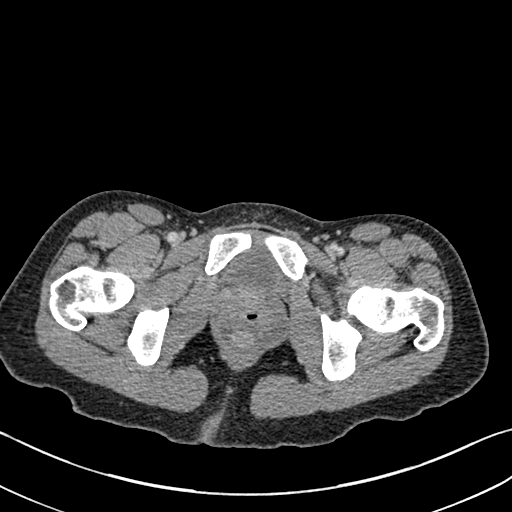
[im 116/576  soft-tissue]
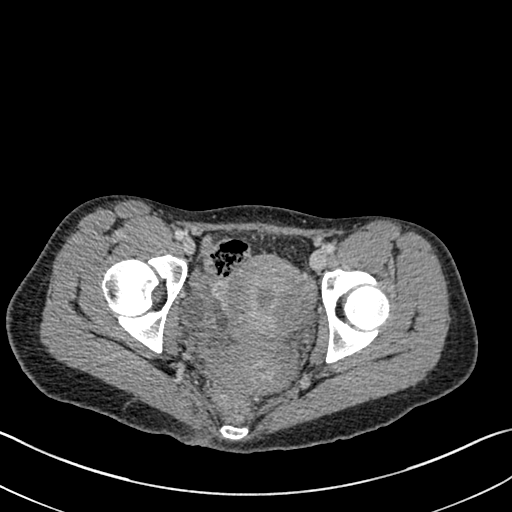
[im 162/576  soft-tissue]
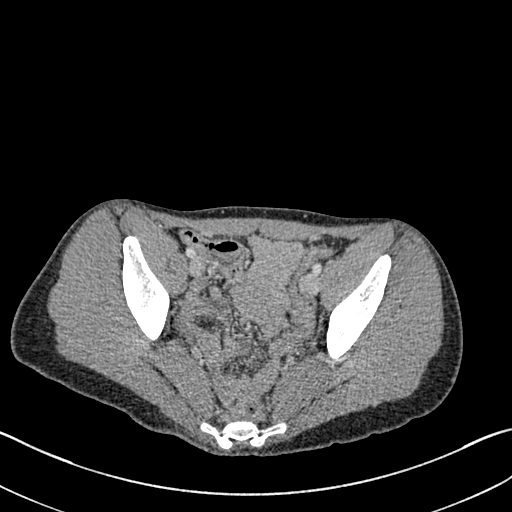
[im 208/576  soft-tissue]
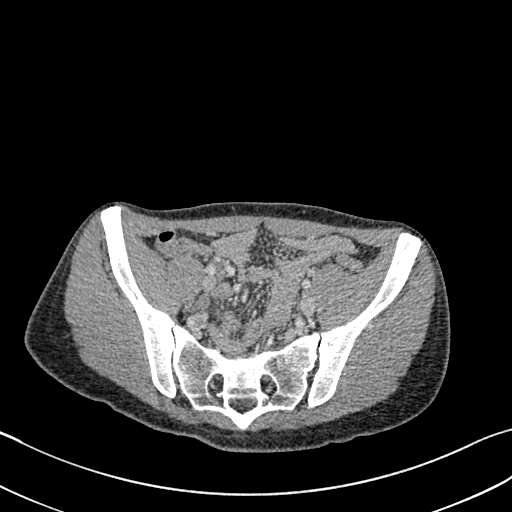
[im 254/576  soft-tissue]
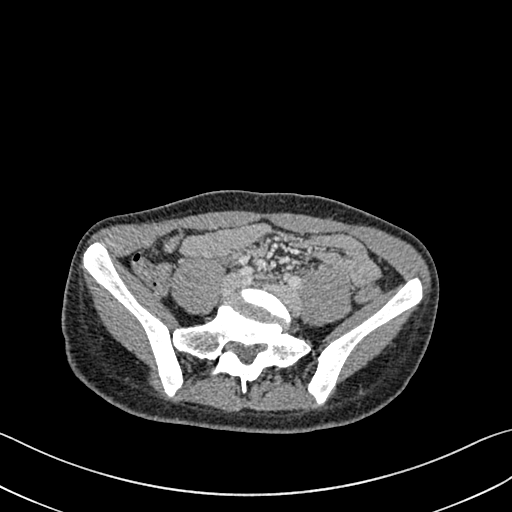
[im 300/576  soft-tissue]
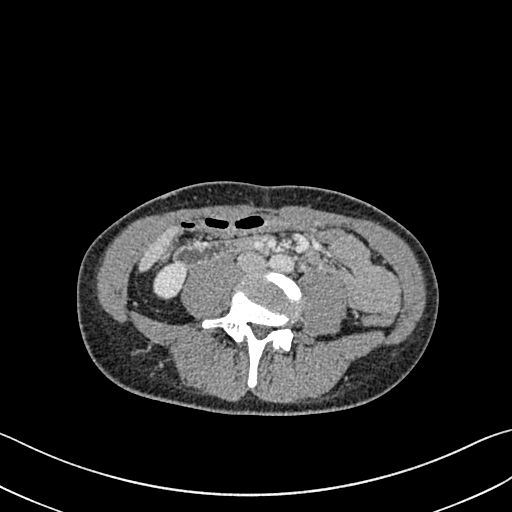
[im 323/576  soft-tissue]
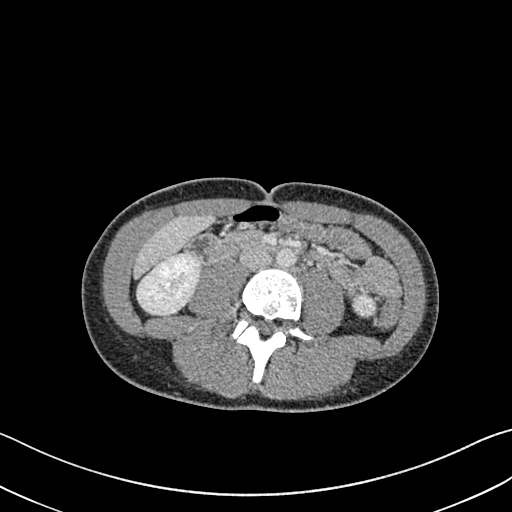
[im 369/576  soft-tissue]
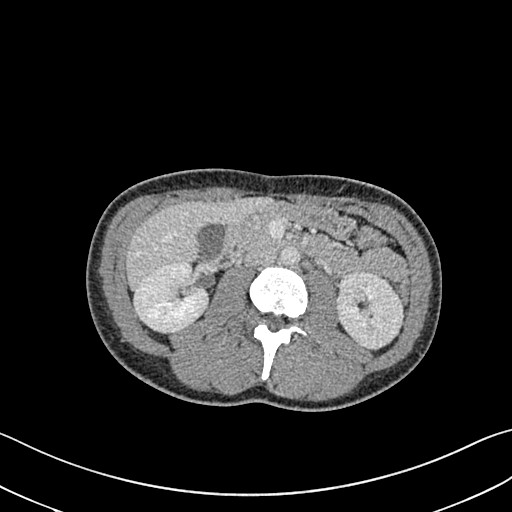
[im 369/576  bone]
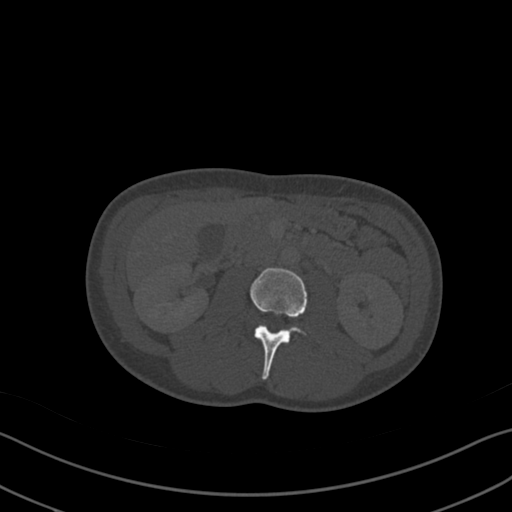
[im 415/576  soft-tissue]
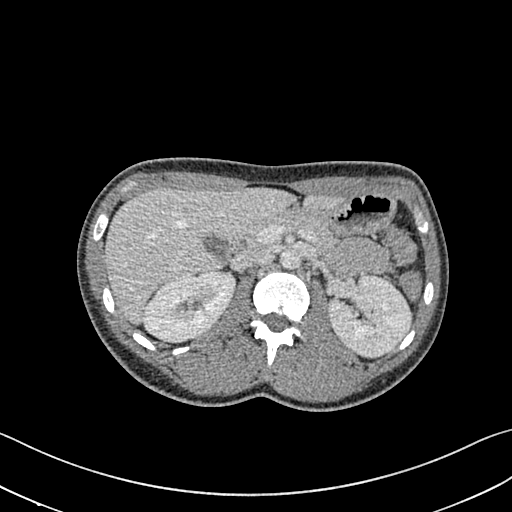
[im 461/576  soft-tissue]
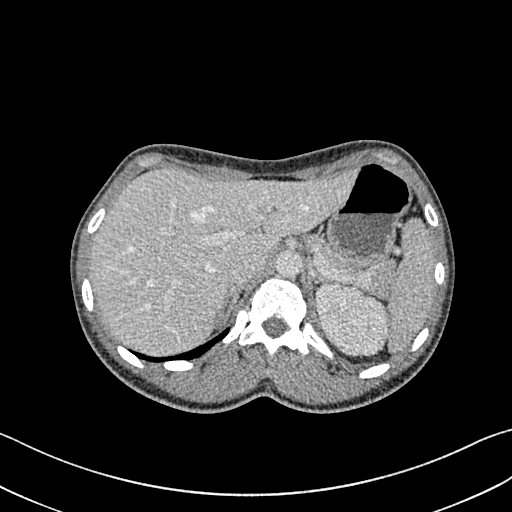
[im 507/576  soft-tissue]
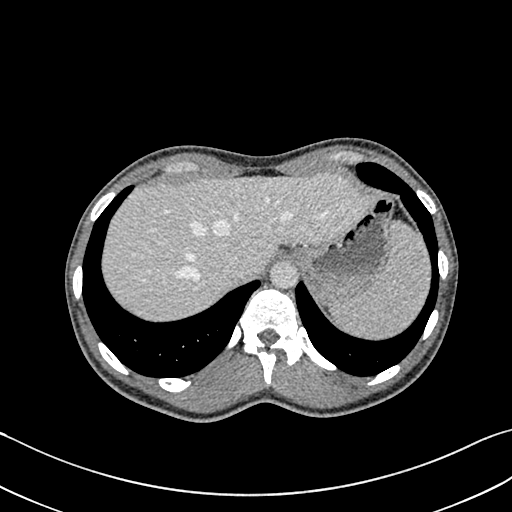
[im 553/576  soft-tissue]
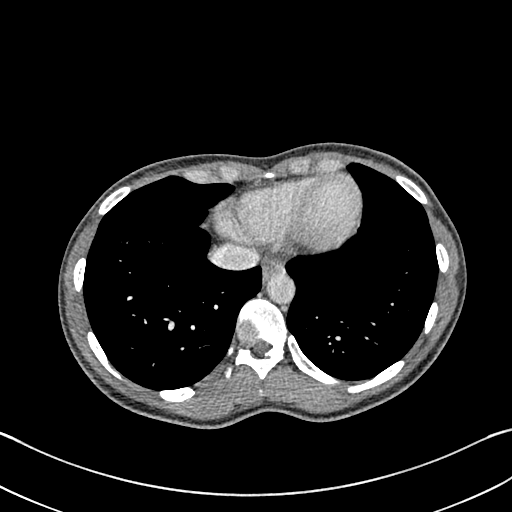

[Series 4: coronal st · coronal · 0.70mm/px · 3 of 105 slices shown]
[im 35/105  soft-tissue]
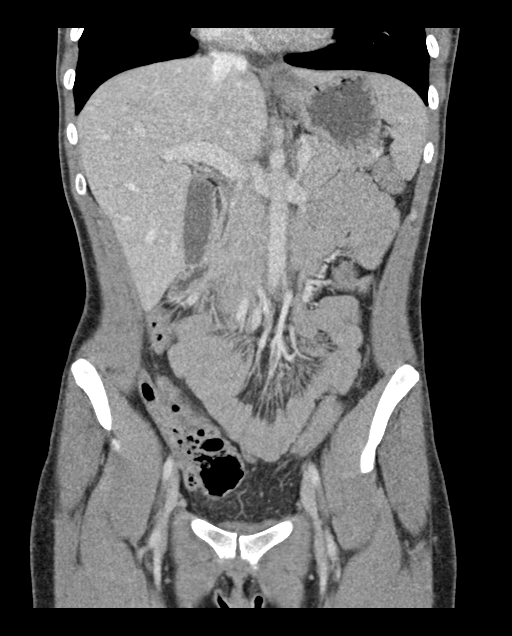
[im 47/105  soft-tissue]
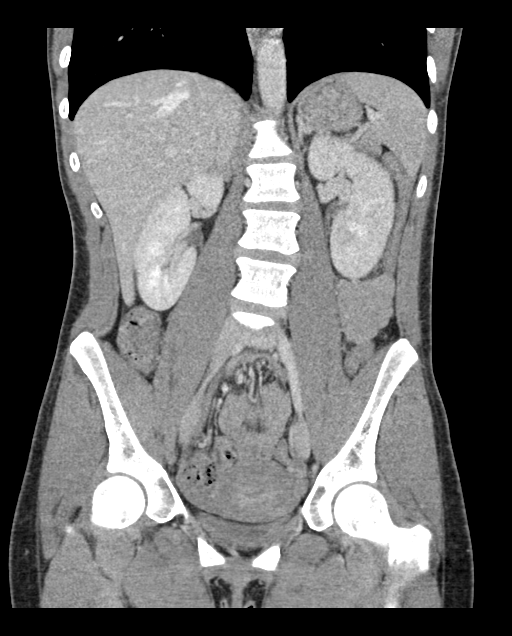
[im 58/105  soft-tissue]
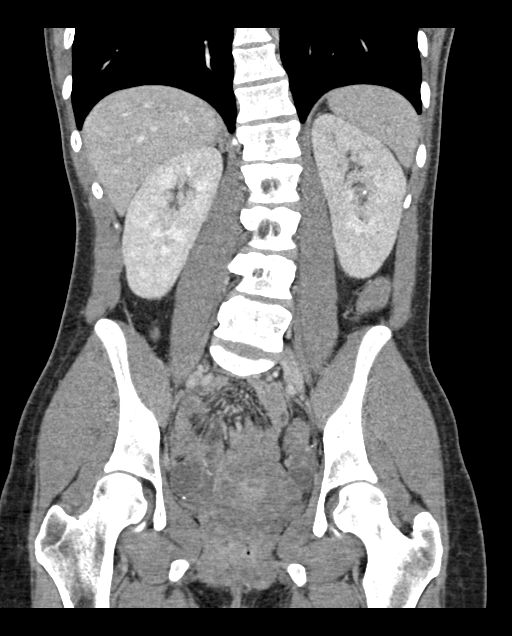

[16 of 46 positions shown; findings below may reference images not displayed]

FINDINGS: Lower chest: No acute abnormality.

Hepatobiliary: No focal liver abnormality is seen. No radiopaque
gallstones, biliary dilatation, or pericholecystic inflammatory
changes.

Pancreas: Unremarkable. No pancreatic ductal dilatation or
surrounding inflammatory changes.

Spleen: Normal in size without focal abnormality.

Adrenals/Urinary Tract: Adrenal glands are unremarkable. Kidneys are
normal, without renal calculi, focal lesion, or hydronephrosis. The
bladder is decompressed.

Stomach/Bowel: Stomach is normal in appearance. Small bowel loops
are unremarkable. Loops of colon are unremarkable. The appendix is
well seen and has a normal appearance.

Vascular/Lymphatic: No significant vascular findings are present. No
enlarged abdominal or pelvic lymph nodes.

Reproductive: Uterus is present. RIGHT adnexal low-attenuation mass
is 2.4 centimeters. Smaller low-attenuation lesions are identified
MEDIAL to this mass, possibly representing ovarian follicles or
fluid within the fallopian tube. LEFT adnexal low-attenuation mass
is 2.5 centimeters. No free pelvic fluid.

Other: Anterior abdominal wall is unremarkable.

Musculoskeletal: No acute or significant osseous findings.
IMPRESSION: 1. Normal appendix.
2. Bilateral adnexal cysts. Question of RIGHT hydrosalpinx versus
additional ovarian cysts/follicles. Consider further evaluation with
pelvic ultrasound for characterization.
3. No bowel obstruction.

## 2021-03-18 ENCOUNTER — Emergency Department (HOSPITAL_COMMUNITY)
Admission: EM | Admit: 2021-03-18 | Discharge: 2021-03-18 | Disposition: A | Discharge status: Home / Self Care | Payer: Medicaid Other | Attending: Emergency Medicine | Admitting: Emergency Medicine

## 2021-03-18 ENCOUNTER — Other Ambulatory Visit: Payer: Self-pay

## 2021-03-18 DIAGNOSIS — R112 Nausea with vomiting, unspecified: Secondary | ICD-10-CM | POA: Diagnosis present

## 2021-03-18 DIAGNOSIS — Z8719 Personal history of other diseases of the digestive system: Secondary | ICD-10-CM | POA: Insufficient documentation

## 2021-03-18 DIAGNOSIS — R1084 Generalized abdominal pain: Secondary | ICD-10-CM | POA: Insufficient documentation

## 2021-03-18 DIAGNOSIS — F1290 Cannabis use, unspecified, uncomplicated: Secondary | ICD-10-CM

## 2021-03-18 LAB — URINALYSIS, ROUTINE W REFLEX MICROSCOPIC
Bacteria, UA: NONE SEEN
Bilirubin Urine: NEGATIVE
Glucose, UA: NEGATIVE mg/dL
Hgb urine dipstick: NEGATIVE
Ketones, ur: 20 mg/dL — AB
Leukocytes,Ua: NEGATIVE
Nitrite: NEGATIVE
Protein, ur: 30 mg/dL — AB
Specific Gravity, Urine: 1.024 (ref 1.005–1.030)
pH: 7 (ref 5.0–8.0)

## 2021-03-18 LAB — LIPASE, BLOOD: Lipase: 28 U/L (ref 11–51)

## 2021-03-18 LAB — RAPID URINE DRUG SCREEN, HOSP PERFORMED
Amphetamines: NOT DETECTED
Barbiturates: NOT DETECTED
Benzodiazepines: NOT DETECTED
Cocaine: NOT DETECTED
Opiates: NOT DETECTED
Tetrahydrocannabinol: POSITIVE — AB

## 2021-03-18 LAB — CBC WITH DIFFERENTIAL/PLATELET
Abs Immature Granulocytes: 0.04 10*3/uL (ref 0.00–0.07)
Basophils Absolute: 0 10*3/uL (ref 0.0–0.1)
Basophils Relative: 0 %
Eosinophils Absolute: 0 10*3/uL (ref 0.0–0.5)
Eosinophils Relative: 0 %
HCT: 41.5 % (ref 36.0–46.0)
Hemoglobin: 14.3 g/dL (ref 12.0–15.0)
Immature Granulocytes: 0 %
Lymphocytes Relative: 15 %
Lymphs Abs: 1.6 10*3/uL (ref 0.7–4.0)
MCH: 30.8 pg (ref 26.0–34.0)
MCHC: 34.5 g/dL (ref 30.0–36.0)
MCV: 89.4 fL (ref 80.0–100.0)
Monocytes Absolute: 0.5 10*3/uL (ref 0.1–1.0)
Monocytes Relative: 4 %
Neutro Abs: 8.5 10*3/uL — ABNORMAL HIGH (ref 1.7–7.7)
Neutrophils Relative %: 81 %
Platelets: 373 10*3/uL (ref 150–400)
RBC: 4.64 MIL/uL (ref 3.87–5.11)
RDW: 13 % (ref 11.5–15.5)
WBC: 10.6 10*3/uL — ABNORMAL HIGH (ref 4.0–10.5)
nRBC: 0 % (ref 0.0–0.2)

## 2021-03-18 LAB — COMPREHENSIVE METABOLIC PANEL
ALT: 14 U/L (ref 0–44)
AST: 21 U/L (ref 15–41)
Albumin: 4.7 g/dL (ref 3.5–5.0)
Alkaline Phosphatase: 57 U/L (ref 38–126)
Anion gap: 10 (ref 5–15)
BUN: 9 mg/dL (ref 6–20)
CO2: 24 mmol/L (ref 22–32)
Calcium: 9.5 mg/dL (ref 8.9–10.3)
Chloride: 107 mmol/L (ref 98–111)
Creatinine, Ser: 0.84 mg/dL (ref 0.44–1.00)
GFR, Estimated: >60 mL/min (ref >60)
Glucose, Bld: 107 mg/dL — ABNORMAL HIGH (ref 70–99)
Potassium: 3.5 mmol/L (ref 3.5–5.1)
Sodium: 141 mmol/L (ref 135–145)
Total Bilirubin: 1 mg/dL (ref 0.3–1.2)
Total Protein: 8.3 g/dL — ABNORMAL HIGH (ref 6.5–8.1)

## 2021-03-18 LAB — I-STAT BETA HCG BLOOD, ED (MC, WL, AP ONLY): I-stat hCG, quantitative: <5 m[IU]/mL (ref <5)

## 2021-03-18 MED ORDER — ALUM & MAG HYDROXIDE-SIMETH 200-200-20 MG/5ML PO SUSP
30.0000 mL | Freq: Once | ORAL | Status: AC
  Administered 2021-03-18: 30 mL via ORAL
  Filled 2021-03-18: qty 30

## 2021-03-18 MED ORDER — LIDOCAINE VISCOUS HCL 2 % MT SOLN
15.0000 mL | Freq: Once | OROMUCOSAL | Status: AC
  Administered 2021-03-18: 15 mL via OROMUCOSAL
  Filled 2021-03-18: qty 15

## 2021-03-18 MED ORDER — ONDANSETRON HCL 4 MG/2ML IJ SOLN
4.0000 mg | Freq: Once | INTRAMUSCULAR | Status: AC
  Administered 2021-03-18: 4 mg via INTRAVENOUS
  Filled 2021-03-18: qty 2

## 2021-03-18 MED ORDER — HALOPERIDOL LACTATE 5 MG/ML IJ SOLN
2.0000 mg | Freq: Once | INTRAMUSCULAR | Status: AC
  Administered 2021-03-18: 2 mg via INTRAVENOUS
  Filled 2021-03-18: qty 1

## 2021-03-18 MED ORDER — ONDANSETRON HCL 4 MG PO TABS: 4.0000 mg | ORAL_TABLET | Freq: Four times a day (QID) | ORAL | 0 refills | Status: DC

## 2021-03-18 MED ORDER — FAMOTIDINE IN NACL 20-0.9 MG/50ML-% IV SOLN
20.0000 mg | Freq: Once | INTRAVENOUS | Status: AC
  Administered 2021-03-18: 20 mg via INTRAVENOUS
  Filled 2021-03-18: qty 50

## 2021-03-18 MED ORDER — CAPSAICIN 0.025 % EX CREA
TOPICAL_CREAM | Freq: Once | CUTANEOUS | Status: AC
  Filled 2021-03-18: qty 60

## 2021-03-18 MED ORDER — ONDANSETRON 8 MG PO TBDP
8.0000 mg | ORAL_TABLET | Freq: Once | ORAL | Status: DC
  Filled 2021-03-18: qty 1

## 2021-03-18 MED ORDER — SODIUM CHLORIDE 0.9 % IV BOLUS
1000.0000 mL | Freq: Once | INTRAVENOUS | Status: AC
  Administered 2021-03-18: 1000 mL via INTRAVENOUS

## 2021-03-18 NOTE — ED Provider Notes (Signed)
[facility]   CSN: [id number] Arrival date & time: 03/18/21  1025     History Chief Complaint  Patient presents with  . Emesis    Heather Joyce is a 30 y.o. female.  HPI   Patient presents with nausea and vomiting x 2 days. States she is vomiting more than 10x daily. There is some streaking blood that started yesterday in the vomit. She also reports lower abdominal pain that started after the vomiting. She has a history of GERD and hasn't been taking medications due to cost. She denies fever, diarrhea, dysuria or bloody stool. She last used marijuana weeks ago but reports using it most days.   Past Medical History:  Diagnosis Date  . 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 SVD (spontaneous vaginal delivery) 07/05/2017  . Shortness of breath   . [redacted] weeks gestation of pregnancy   . Gastroesophageal reflux disease   . Chest pain 01/02/2017  . Hyponatremia 01/02/2017  . Hypokalemia 01/02/2017  . Pregnancy 01/02/2017  . Leukocytosis 01/02/2017    Past Surgical History:  Procedure Laterality Date  . DENTAL SURGERY  2011   wisdom teeth removed     OB History    Gravida  2   Para  1   Term  1   Preterm      AB  1   Living  1     SAB  1   IAB      Ectopic      Multiple  0   Live Births  1           Family History  Problem Relation Age of Onset  . Hypertension Father   . Hypertension Paternal Grandfather   . Esophageal cancer Paternal Grandfather   . Colon cancer Neg Hx     Social History   Tobacco Use  . Smoking status: Never Smoker  . Smokeless tobacco: Never Used  Vaping Use  . Vaping Use: Never used  Substance Use Topics  . Alcohol use: Yes    Comment: ocassionally  . Drug use: Yes    Types: Marijuana    Home Medications Prior to Admission medications   Medication Sig Start Date End Date Taking? Authorizing Provider  ondansetron (ZOFRAN ODT)  4 MG disintegrating tablet Take 1 tablet (4 mg total) by mouth every 8 (eight) hours as needed for nausea or vomiting. 07/30/19   Georgetta Haber, NP  promethazine (PHENERGAN) 25 MG tablet Take 1 tablet (25 mg total) by mouth every 8 (eight) hours as needed for nausea, vomiting or refractory nausea / vomiting. 07/30/19   Georgetta Haber, NP  dicyclomine (BENTYL) 10 MG capsule Take 1 capsule (10 mg total) by mouth 3 (three) times daily before meals. Patient not taking: Reported on 05/26/2019 07/25/18 05/26/19  Meryl Dare, MD  pantoprazole (PROTONIX) 40 MG tablet Take 1 tablet (40 mg total) by mouth daily. Patient not taking: Reported on 05/26/2019 08/07/18 05/26/19  Meryl Dare, MD    Allergies    Penicillins, Pineapple, and Food  Review of Systems   Review of Systems  Constitutional: Negative for chills and fever.  HENT: Negative for ear pain and sore throat.   Eyes: Negative for pain and visual disturbance.  Respiratory: Negative for cough and shortness of breath.   Cardiovascular: Negative for chest pain and palpitations.  Gastrointestinal: Positive for abdominal pain, nausea and  vomiting.  Genitourinary: Negative for dysuria and hematuria.  Musculoskeletal: Negative for arthralgias and back pain.  Skin: Negative for color change and rash.  Neurological: Negative for seizures and syncope.  All other systems reviewed and are negative.   Physical Exam Updated Vital Signs BP (!) 172/103   Pulse 85   Temp 98.1 F (36.7 C) (Oral)   Resp 18   LMP 03/18/2021   SpO2 100%   Physical Exam Vitals and nursing note reviewed. Exam conducted with a chaperone present.  Constitutional:      Appearance: Normal appearance.     Comments: Patient with emesis bag partially filled.   HENT:     Head: Normocephalic and atraumatic.  Eyes:     General: No scleral icterus.       Right eye: No discharge.        Left eye: No discharge.     Extraocular Movements: Extraocular movements intact.      Pupils: Pupils are equal, round, and reactive to light.  Cardiovascular:     Rate and Rhythm: Normal rate and regular rhythm.     Pulses: Normal pulses.     Heart sounds: Normal heart sounds. No murmur heard. No friction rub. No gallop.   Pulmonary:     Effort: Pulmonary effort is normal. No respiratory distress.     Breath sounds: Normal breath sounds.  Abdominal:     General: Abdomen is flat. Bowel sounds are normal. There is no distension.     Palpations: Abdomen is soft.     Tenderness: There is generalized abdominal tenderness.     Comments: Diffusely tender, no guarding, no rebound.   Skin:    General: Skin is warm and dry.     Coloration: Skin is not jaundiced.  Neurological:     Mental Status: She is alert. Mental status is at baseline.     Coordination: Coordination normal.     ED Results / Procedures / Treatments   Labs (all labs ordered are listed, but only abnormal results are displayed) Labs Reviewed - No data to display  EKG None  Radiology No results found.  Procedures Procedures   Medications Ordered in ED Medications  ondansetron (ZOFRAN-ODT) disintegrating tablet 8 mg (has no administration in time range)  sodium chloride 0.9 % bolus 1,000 mL (has no administration in time range)  alum & mag hydroxide-simeth (MAALOX/MYLANTA) 200-200-20 MG/5ML suspension 30 mL (has no administration in time range)    ED Course  I have reviewed the triage vital signs and the nursing notes.  Pertinent labs & imaging results that were available during my care of the patient were reviewed by me and considered in my medical decision making (see chart for details).    MDM Rules/Calculators/A&P                          Patient with history of GERD presents for nausea and vomiting. She is nontoxic appearing, vitals show elevated BP to 172/103 but otherwise are stable. PE showed diffuse pain I believe to be from constant vomiting. High suspicion for cannabis  hyperemesis given reported heavy use and intractable vomiting. Also on the ddx is gallbladder disease, pancreatitis, gastritis, GERD, appendicitis. Will order basic labs and consider abdominal ultrasound if LFTs are elevated. Zofran, Gi cocktail, and fluids given for symptom control.   CBC: No elevated WBC concerning for infection, no anemia   Patient continues vomiting in room. Will try haldol and capsaison  cream.  Patient is feeling better. Discussed cannabis hyperemesis and discontinuing marijuana use. Patient voiced understanding. She has been told in the past that this is likely the cause of her intractable vomiting. She desires to be discharged and will follow up with her PCP within the next 2 weeks. Return precautions were given.   Final Clinical Impression(s) / ED Diagnoses Final diagnoses:  None    Rx / DC Orders ED Discharge Orders    None       Theron Arista, PA-C 03/18/21 1551    Arby Barrette, MD 04/04/21 [phone]

## 2021-03-18 NOTE — Discharge Instructions (Addendum)
You were seen today in the ED for for vomiting and nausea. We did not any signs of emergent pathology in your labs today. 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 To treat this, you should cease marijuana use for the next 6 months. You should also call to establish care with [facility] and follow up with your PCP within the next 2 weeks. Please return to the ED if you are unable to keep fluids in, if you develop of fever of 100.2 or if your heart rate is greater than 100 beats per minutes. I have sent you zofran to take for nausea and vomiting as needed every 6 hours.

## 2021-03-18 NOTE — ED Notes (Signed)
Patient informed of need for urine specimen 

## 2021-03-18 NOTE — ED Triage Notes (Signed)
Pt reports Nausea & vomiting since Tuesday night. Pt thinks she may have seen blood in the emesis once.

## 2021-09-26 ENCOUNTER — Other Ambulatory Visit: Payer: Self-pay

## 2021-09-26 ENCOUNTER — Encounter (HOSPITAL_COMMUNITY): Payer: Self-pay

## 2021-09-26 ENCOUNTER — Ambulatory Visit (HOSPITAL_COMMUNITY)
Admission: EM | Admit: 2021-09-26 | Discharge: 2021-09-26 | Disposition: A | Discharge status: Home / Self Care | Payer: Medicaid Other | Attending: Student | Admitting: Student

## 2021-09-26 DIAGNOSIS — H00014 Hordeolum externum left upper eyelid: Secondary | ICD-10-CM | POA: Diagnosis not present

## 2021-09-26 MED ORDER — POLYMYXIN B-TRIMETHOPRIM 10000-0.1 UNIT/ML-% OP SOLN: 1.0000 [drp] | OPHTHALMIC | 0 refills | Status: AC

## 2021-09-26 NOTE — Discharge Instructions (Addendum)
-  I think that you either have a stye forming or a small scratch on your cornea.  -Use the Polytrim drops for your L eye.  Put 1 drop in the affected eye every 4 hours while awake for 7 days.   -You can also use warm compresses 1-2 times daily. -Don't wear contacts for 7 days! -If your symptoms get worse instead of better, follow-up immediately with ophthalmologist/eye doctor.  This includes vision changes, eyelid swelling, pain with movement, worsening of redness despite treatment.

## 2021-09-26 NOTE — ED Triage Notes (Signed)
Pt presents with left eye irritation and drainage since waking up this morning.

## 2021-09-26 NOTE — ED Provider Notes (Signed)
[facility]    CSN: [id number] Arrival date & time: 09/26/21  1315      History   Chief Complaint Chief Complaint  Patient presents with   Eye Problem    HPI URI Heather Joyce is a 30 y.o. female presenting with L eye irritation and watering since this morning, when she woke up. Medical history noncontributory, occ wears glasses but not contacts. States she woke up and rubbed her eyes and immediately felt foreign body sensation upper lid with watering, photophobia, and swelling. Concerned she is developing a hordeolum which she does have a history of.  Denies eye redness, eye crusting in the morning, eye pain, eye pain with movement, injury to eye, vision changes, double vision, burning eyes. She does have an eye doctor already.  HPI  Past Medical History:  Diagnosis Date   Rabies exposure     Patient Active Problem List   Diagnosis Date Noted   SVD (spontaneous vaginal delivery) 07/05/2017   Shortness of breath    [redacted] weeks gestation of pregnancy    Gastroesophageal reflux disease    Chest pain 01/02/2017   Hyponatremia 01/02/2017   Hypokalemia 01/02/2017   Pregnancy 01/02/2017   Leukocytosis 01/02/2017    Past Surgical History:  Procedure Laterality Date   DENTAL SURGERY  2011   wisdom teeth removed    OB History     Gravida  2   Para  1   Term  1   Preterm      AB  1   Living  1      SAB  1   IAB      Ectopic      Multiple  0   Live Births  1            Home Medications    Prior to Admission medications   Medication Sig Start Date End Date Taking? Authorizing Provider  trimethoprim-polymyxin b (POLYTRIM) ophthalmic solution Place 1 drop into the left eye every 4 (four) hours for 7 days. 09/26/21 10/03/21 Yes Rhys Martini, PA-C  ondansetron (ZOFRAN ODT) 4 MG disintegrating tablet Take 1 tablet (4 mg total) by mouth every 8 (eight) hours as needed for nausea or vomiting. 07/30/19   Georgetta Haber, NP  ondansetron (ZOFRAN)  4 MG tablet Take 1 tablet (4 mg total) by mouth every 6 (six) hours. 03/18/21   Theron Arista, PA-C  promethazine (PHENERGAN) 25 MG tablet Take 1 tablet (25 mg total) by mouth every 8 (eight) hours as needed for nausea, vomiting or refractory nausea / vomiting. 07/30/19   Georgetta Haber, NP  dicyclomine (BENTYL) 10 MG capsule Take 1 capsule (10 mg total) by mouth 3 (three) times daily before meals. Patient not taking: Reported on 05/26/2019 07/25/18 05/26/19  Meryl Dare, MD  pantoprazole (PROTONIX) 40 MG tablet Take 1 tablet (40 mg total) by mouth daily. Patient not taking: Reported on 05/26/2019 08/07/18 05/26/19  Meryl Dare, MD    Family History Family History  Problem Relation Age of Onset   Hypertension Father    Hypertension Paternal Grandfather    Esophageal cancer Paternal Grandfather    Colon cancer Neg Hx     Social History Social History   Tobacco Use   Smoking status: Never   Smokeless tobacco: Never  Vaping Use   Vaping Use: Never used  Substance Use Topics   Alcohol use: Yes    Comment: ocassionally   Drug use: Yes  Types: Marijuana     Allergies   Penicillins, Pineapple, and Food   Review of Systems Review of Systems  Eyes:  Positive for photophobia. Negative for pain, discharge, redness, itching and visual disturbance.  All other systems reviewed and are negative.   Physical Exam Triage Vital Signs ED Triage Vitals  Enc Vitals Group     BP 09/26/21 1517 129/79     Pulse Rate 09/26/21 1517 77     Resp 09/26/21 1517 18     Temp 09/26/21 1517 98.8 F (37.1 C)     Temp Source 09/26/21 1517 Oral     SpO2 09/26/21 1517 98 %     Weight --      Height --      Head Circumference --      Peak Flow --      Pain Score 09/26/21 1521 6     Pain Loc --      Pain Edu? --      Excl. in GC? --    No data found.  Updated Vital Signs BP 129/79 (BP Location: Left Arm)   Pulse 77   Temp 98.8 F (37.1 C) (Oral)   Resp 18   LMP 09/13/2021   SpO2  98%   Visual Acuity Right Eye Distance:   Left Eye Distance:   Bilateral Distance:    Right Eye Near: R Near: 20/70 without glasses Left Eye Near:  L Near: 20/50 without glasses Bilateral Near:     Physical Exam Vitals reviewed.  Constitutional:      General: She is not in acute distress.    Appearance: Normal appearance. She is not ill-appearing.  HENT:     Head: Normocephalic and atraumatic.     Right Ear: Hearing, tympanic membrane, ear canal and external ear normal. No tenderness. No middle ear effusion. There is no impacted cerumen. No mastoid tenderness. Tympanic membrane is not perforated, erythematous, retracted or bulging.     Left Ear: Hearing, tympanic membrane, ear canal and external ear normal. No tenderness.  No middle ear effusion. There is no impacted cerumen. No mastoid tenderness. Tympanic membrane is not perforated, erythematous, retracted or bulging.     Mouth/Throat:     Pharynx: No posterior oropharyngeal erythema.  Eyes:     General: Lids are everted, no foreign bodies appreciated. Vision grossly intact. Gaze aligned appropriately. No visual field deficit.       Right eye: No foreign body, discharge or hordeolum.        Left eye: No foreign body, discharge or hordeolum.     Extraocular Movements: Extraocular movements intact.     Right eye: No nystagmus.     Left eye: No nystagmus.     Conjunctiva/sclera:     Right eye: Right conjunctiva is not injected. No chemosis, exudate or hemorrhage.    Left eye: Left conjunctiva is not injected. No chemosis, exudate or hemorrhage.    Pupils: Pupils are equal, round, and reactive to light. Pupils are equal.     Right eye: No corneal abrasion or fluorescein uptake. Seidel exam negative.     Left eye: No corneal abrasion or fluorescein uptake. Seidel exam negative.    Visual Fields: Right eye visual fields normal and left eye visual fields normal.     Comments: L eye- watering. Upper lid is mildly swollen alone mid-lash  line, without obvious stye. PERRLA. EOMI without pain. No proptosis. Anterior chamber, conjunctivae, sclera all without hemorrhage or visible injury. Visual acuity  grossly intact. No fluorescein uptake/ corneal abrasion visualized.  Cardiovascular:     Rate and Rhythm: Normal rate and regular rhythm.     Heart sounds: Normal heart sounds.  Pulmonary:     Effort: Pulmonary effort is normal.     Breath sounds: Normal breath sounds and air entry.  Lymphadenopathy:     Cervical: No cervical adenopathy.  Neurological:     General: No focal deficit present.     Mental Status: She is alert and oriented to person, place, and time.  Psychiatric:        Attention and Perception: Attention and perception normal.        Mood and Affect: Mood and affect normal.     UC Treatments / Results  Labs (all labs ordered are listed, but only abnormal results are displayed) Labs Reviewed - No data to display  EKG   Radiology No results found.  Procedures Procedures (including critical care time)  Medications Ordered in UC Medications - No data to display  Initial Impression / Assessment and Plan / UC Course  I have reviewed the triage vital signs and the nursing notes.  Pertinent labs & imaging results that were available during my care of the patient were reviewed by me and considered in my medical decision making (see chart for details).     This patient is a very pleasant 30 y.o. year old female presenting with early L hordeolum internum vs.corneal abrasion. Visual acuity intact, doesn't wear glasses/contacts. No fluorescein uptake.  Will manage with Polytrim drops and strict ophthalmology return precautions. Patient verbalizes understanding and agreement.    Final Clinical Impressions(s) / UC Diagnoses   Final diagnoses:  Hordeolum externum left upper eyelid     Discharge Instructions      -I think that you either have a stye forming or a small scratch on your cornea.  -Use the  Polytrim drops for your L eye.  Put 1 drop in the affected eye every 4 hours while awake for 7 days.   -You can also use warm compresses 1-2 times daily. -Don't wear contacts for 7 days! -If your symptoms get worse instead of better, follow-up immediately with ophthalmologist/eye doctor.  This includes vision changes, eyelid swelling, pain with movement, worsening of redness despite treatment.    ED Prescriptions     Medication Sig Dispense Auth. Provider   trimethoprim-polymyxin b (POLYTRIM) ophthalmic solution Place 1 drop into the left eye every 4 (four) hours for 7 days. 10 mL Hazel Sams, PA-C      PDMP not reviewed this encounter.   Hazel Sams, PA-C 09/26/21 1601

## 2023-03-13 ENCOUNTER — Telehealth: Payer: Medicaid Other | Admitting: Nurse Practitioner

## 2023-03-13 ENCOUNTER — Encounter (HOSPITAL_COMMUNITY): Payer: Self-pay

## 2023-03-13 ENCOUNTER — Ambulatory Visit (HOSPITAL_COMMUNITY)
Admission: RE | Admit: 2023-03-13 | Discharge: 2023-03-13 | Disposition: A | Discharge status: Home / Self Care | Payer: Medicaid Other | Source: Ambulatory Visit | Attending: Family Medicine | Admitting: Family Medicine

## 2023-03-13 VITALS — BP 134/83 | HR 76 | Temp 98.7°F | Resp 18

## 2023-03-13 DIAGNOSIS — N939 Abnormal uterine and vaginal bleeding, unspecified: Secondary | ICD-10-CM | POA: Insufficient documentation

## 2023-03-13 DIAGNOSIS — N926 Irregular menstruation, unspecified: Secondary | ICD-10-CM

## 2023-03-13 DIAGNOSIS — Z113 Encounter for screening for infections with a predominantly sexual mode of transmission: Secondary | ICD-10-CM | POA: Diagnosis present

## 2023-03-13 LAB — CBC
HCT: 41.9 % (ref 36.0–46.0)
Hemoglobin: 13.9 g/dL (ref 12.0–15.0)
MCH: 30.4 pg (ref 26.0–34.0)
MCHC: 33.2 g/dL (ref 30.0–36.0)
MCV: 91.7 fL (ref 80.0–100.0)
Platelets: 451 10*3/uL — ABNORMAL HIGH (ref 150–400)
RBC: 4.57 MIL/uL (ref 3.87–5.11)
RDW: 13.8 % (ref 11.5–15.5)
WBC: 8.8 10*3/uL (ref 4.0–10.5)
nRBC: 0 % (ref 0.0–0.2)

## 2023-03-13 LAB — POCT URINE PREGNANCY: Preg Test, Ur: NEGATIVE

## 2023-03-13 LAB — HIV ANTIBODY (ROUTINE TESTING W REFLEX): HIV Screen 4th Generation wRfx: NONREACTIVE

## 2023-03-13 MED ORDER — MEGESTROL ACETATE 40 MG PO TABS: 40.0000 mg | ORAL_TABLET | Freq: Two times a day (BID) | ORAL | 0 refills | Status: DC

## 2023-03-13 NOTE — ED Provider Notes (Signed)
[facility]   [id number] 03/13/23 Arrival Time: 1012  ASSESSMENT & PLAN:  1. Abnormal vaginal bleeding   2. Screening for STDs (sexually transmitted diseases)      Discharge Instructions      You have had labs (STD testing and a blood count) sent today. We will call you with any significant abnormalities or if there is need to begin or change treatment or pursue further follow up.  You may also review your test results online through MyChart. If you do not have a MyChart account, instructions to sign up should be on your discharge paperwork.     Without s/s of PID.  Labs Reviewed  HIV ANTIBODY (ROUTINE TESTING W REFLEX)  RPR  CBC  POCT URINE PREGNANCY  CERVICOVAGINAL ANCILLARY ONLY   UPT negative. Others are pending.  Has a gyn provider with whom she will schedule prompt follow up. Begin: Meds ordered this encounter  Medications   megestrol (MEGACE) 40 MG tablet    Sig: Take 1 tablet (40 mg total) by mouth 2 (two) times daily.    Dispense:  60 tablet    Refill:  0    Will notify of any positive results. Instructed to refrain from sexual activity for at least seven days.  Reviewed expectations re: course of current medical issues. Questions answered. Outlined signs and symptoms indicating need for more acute intervention. Patient verbalized understanding. After Visit Summary given.   SUBJECTIVE:  Heather Joyce is a 32 y.o. female who presents with complaint of vaginal bleeding. Always with irregular periods; currently bleeding x 6-7 days; is slowing a little. Denies abd pain and n/v. Is followed by gyn.  Patient's last menstrual period was 02/24/2023 (exact date). Desires STD testing. Denies vaginal discharge. Denies fever.  OBJECTIVE:  Vitals:   03/13/23 1043  BP: 134/83  Pulse: 76  Resp: 18  Temp: 98.7 F (37.1 C)  TempSrc: Oral  SpO2: 97%     General appearance: alert, cooperative, appears stated age and no distress Lungs:  unlabored respirations; speaks full sentences without difficulty Back: no CVA tenderness; FROM at waist Abdomen: soft, non-tender GU: deferred Skin: warm and dry Psychological: alert and cooperative; normal mood and affect.  Results for orders placed or performed during the hospital encounter of 03/13/23  POCT urine pregnancy  Result Value Ref Range   Preg Test, Ur Negative Negative    Labs Reviewed  HIV ANTIBODY (ROUTINE TESTING W REFLEX)  RPR  CBC  POCT URINE PREGNANCY  CERVICOVAGINAL ANCILLARY ONLY    Allergies  Allergen Reactions   Penicillins Anaphylaxis and Other (See Comments)    Has patient had a PCN reaction causing immediate rash, facial/tongue/throat swelling, SOB or lightheadedness with hypotension: Yes Has patient had a PCN reaction causing severe rash involving mucus membranes or skin necrosis: No Has patient had a PCN reaction that required hospitalization: No Has patient had a PCN reaction occurring within the last 10 years: No If all of the above answers are "NO", then may proceed with Cephalosporin use.    Pineapple Anaphylaxis   Food Rash and Other (See Comments)    Pt is allergic to peanuts    Past Medical History:  Diagnosis Date   Rabies exposure    Family History  Problem Relation Age of Onset   Hypertension Father    Hypertension Paternal Grandfather    Esophageal cancer Paternal Grandfather    Colon cancer Neg Hx    Social History   Socioeconomic History  Marital status: Single    Spouse name: Not on file   Number of children: 1   Years of education: Not on file   Highest education level: Not on file  Occupational History   Not on file  Tobacco Use   Smoking status: Never   Smokeless tobacco: Never  Vaping Use   Vaping Use: Never used  Substance and Sexual Activity   Alcohol use: Yes    Comment: ocassionally   Drug use: Yes    Types: Marijuana   Sexual activity: Yes  Other Topics Concern   Not on file  Social History  Narrative   Not on file   Social Determinants of Health   Financial Resource Strain: Not on file  Food Insecurity: Not on file  Transportation Needs: Not on file  Physical Activity: Not on file  Stress: Not on file  Social Connections: Not on file  Intimate Partner Violence: Not on file           Mardella Layman, MD 03/13/23 1226

## 2023-03-13 NOTE — Discharge Instructions (Signed)
You have had labs (STD testing and a blood count) sent today. We will call you with any significant abnormalities or if there is need to begin or change treatment or pursue further follow up.  You may also review your test results online through MyChart. If you do not have a MyChart account, instructions to sign up should be on your discharge paperwork.

## 2023-03-13 NOTE — ED Triage Notes (Signed)
Pt states she has her regular cycle on 02/24/2023 but then she started having vaginal bleeding again last Wednesday and she is now passing clots which is abnormal to her. Pt states she does have unprotected sex but has had the same partner for years.

## 2023-03-13 NOTE — Progress Notes (Signed)
Because these symptoms are outside of the scope of an E-visit, I feel your condition warrants further evaluation and I recommend that you be seen for a face to face visit.  Please contact your OBGYN practice to be seen. Many offices offer virtual options to be seen via video if you are not comfortable going in person to a medical facility at this time.  NOTE: You will NOT be charged for this eVisit.  If you do not have a PCP or OBGYN, [facility] offers a free physician referral service available at [phone]. Our trained staff has the experience, knowledge and resources to put you in touch with a physician who is right for you.    If you are having a true medical emergency please call 911.   Your e-visit answers were reviewed by a board certified advanced clinical practitioner to complete your personal care plan.  Thank you for using e-Visits.

## 2023-03-14 ENCOUNTER — Telehealth (HOSPITAL_COMMUNITY): Payer: Self-pay | Admitting: Emergency Medicine

## 2023-03-14 LAB — CERVICOVAGINAL ANCILLARY ONLY
Bacterial Vaginitis (gardnerella): POSITIVE — AB
Candida Glabrata: NEGATIVE
Candida Vaginitis: POSITIVE — AB
Chlamydia: POSITIVE — AB
Comment: NEGATIVE
Comment: NEGATIVE
Comment: NEGATIVE
Comment: NEGATIVE
Comment: NEGATIVE
Comment: NORMAL
Neisseria Gonorrhea: NEGATIVE
Trichomonas: POSITIVE — AB

## 2023-03-14 LAB — RPR: RPR Ser Ql: NONREACTIVE

## 2023-03-14 MED ORDER — DOXYCYCLINE HYCLATE 100 MG PO CAPS: 100.0000 mg | ORAL_CAPSULE | Freq: Two times a day (BID) | ORAL | 0 refills | Status: AC

## 2023-03-14 MED ORDER — FLUCONAZOLE 150 MG PO TABS: 150.0000 mg | ORAL_TABLET | Freq: Once | ORAL | 0 refills | Status: AC

## 2023-03-14 MED ORDER — METRONIDAZOLE 500 MG PO TABS: 500.0000 mg | ORAL_TABLET | Freq: Two times a day (BID) | ORAL | 0 refills | Status: DC

## 2023-03-23 ENCOUNTER — Other Ambulatory Visit: Payer: Self-pay

## 2023-03-23 ENCOUNTER — Emergency Department (HOSPITAL_COMMUNITY)
Admission: EM | Admit: 2023-03-23 | Discharge: 2023-03-23 | Disposition: A | Discharge status: Home / Self Care | Payer: Medicaid Other | Attending: Emergency Medicine | Admitting: Emergency Medicine

## 2023-03-23 DIAGNOSIS — R059 Cough, unspecified: Secondary | ICD-10-CM | POA: Insufficient documentation

## 2023-03-23 DIAGNOSIS — D72829 Elevated white blood cell count, unspecified: Secondary | ICD-10-CM | POA: Diagnosis not present

## 2023-03-23 DIAGNOSIS — R000 Tachycardia, unspecified: Secondary | ICD-10-CM | POA: Insufficient documentation

## 2023-03-23 DIAGNOSIS — R112 Nausea with vomiting, unspecified: Secondary | ICD-10-CM | POA: Diagnosis present

## 2023-03-23 LAB — COMPREHENSIVE METABOLIC PANEL
ALT: 14 U/L (ref 0–44)
AST: 22 U/L (ref 15–41)
Albumin: 4.9 g/dL (ref 3.5–5.0)
Alkaline Phosphatase: 52 U/L (ref 38–126)
Anion gap: 14 (ref 5–15)
BUN: 8 mg/dL (ref 6–20)
CO2: 17 mmol/L — ABNORMAL LOW (ref 22–32)
Calcium: 9.5 mg/dL (ref 8.9–10.3)
Chloride: 108 mmol/L (ref 98–111)
Creatinine, Ser: 0.81 mg/dL (ref 0.44–1.00)
GFR, Estimated: >60 mL/min (ref >60)
Glucose, Bld: 120 mg/dL — ABNORMAL HIGH (ref 70–99)
Potassium: 3.8 mmol/L (ref 3.5–5.1)
Sodium: 139 mmol/L (ref 135–145)
Total Bilirubin: 0.7 mg/dL (ref 0.3–1.2)
Total Protein: 9 g/dL — ABNORMAL HIGH (ref 6.5–8.1)

## 2023-03-23 LAB — URINALYSIS, ROUTINE W REFLEX MICROSCOPIC
Bilirubin Urine: NEGATIVE
Glucose, UA: NEGATIVE mg/dL
Ketones, ur: 20 mg/dL — AB
Nitrite: NEGATIVE
Protein, ur: 30 mg/dL — AB
RBC / HPF: >50 RBC/hpf (ref 0–5)
Specific Gravity, Urine: 1.026 (ref 1.005–1.030)
pH: 6 (ref 5.0–8.0)

## 2023-03-23 LAB — CBC
HCT: 43 % (ref 36.0–46.0)
Hemoglobin: 14.8 g/dL (ref 12.0–15.0)
MCH: 30.7 pg (ref 26.0–34.0)
MCHC: 34.4 g/dL (ref 30.0–36.0)
MCV: 89.2 fL (ref 80.0–100.0)
Platelets: 482 10*3/uL — ABNORMAL HIGH (ref 150–400)
RBC: 4.82 MIL/uL (ref 3.87–5.11)
RDW: 13.5 % (ref 11.5–15.5)
WBC: 11.9 10*3/uL — ABNORMAL HIGH (ref 4.0–10.5)
nRBC: 0 % (ref 0.0–0.2)

## 2023-03-23 LAB — LIPASE, BLOOD: Lipase: 35 U/L (ref 11–51)

## 2023-03-23 LAB — I-STAT BETA HCG BLOOD, ED (MC, WL, AP ONLY): I-stat hCG, quantitative: <5 m[IU]/mL (ref <5)

## 2023-03-23 MED ORDER — ALUM & MAG HYDROXIDE-SIMETH 200-200-20 MG/5ML PO SUSP: 15.0000 mL | Freq: Once | ORAL | Status: DC

## 2023-03-23 MED ORDER — DROPERIDOL 2.5 MG/ML IJ SOLN
1.2500 mg | Freq: Once | INTRAMUSCULAR | Status: AC
  Administered 2023-03-23: 1.25 mg via INTRAVENOUS
  Filled 2023-03-23: qty 2

## 2023-03-23 MED ORDER — ALUM & MAG HYDROXIDE-SIMETH 200-200-20 MG/5ML PO SUSP
30.0000 mL | Freq: Once | ORAL | Status: AC
  Administered 2023-03-23: 30 mL via ORAL
  Filled 2023-03-23: qty 30

## 2023-03-23 MED ORDER — METOCLOPRAMIDE HCL 5 MG/ML IJ SOLN
10.0000 mg | Freq: Once | INTRAMUSCULAR | Status: AC
  Administered 2023-03-23: 10 mg via INTRAVENOUS
  Filled 2023-03-23: qty 2

## 2023-03-23 MED ORDER — ONDANSETRON HCL 4 MG PO TABS: 4.0000 mg | ORAL_TABLET | Freq: Four times a day (QID) | ORAL | 0 refills | Status: DC

## 2023-03-23 MED ORDER — LIDOCAINE VISCOUS HCL 2 % MT SOLN
15.0000 mL | Freq: Once | OROMUCOSAL | Status: AC
  Administered 2023-03-23: 15 mL via ORAL
  Filled 2023-03-23: qty 15

## 2023-03-23 MED ORDER — SODIUM CHLORIDE 0.9 % IV BOLUS
1000.0000 mL | Freq: Once | INTRAVENOUS | Status: AC
  Administered 2023-03-23: 1000 mL via INTRAVENOUS

## 2023-03-23 NOTE — ED Notes (Signed)
Pt d/c with instructions, verbalized understanding,NAD.

## 2023-03-23 NOTE — ED Triage Notes (Signed)
Patient brought in by medics. Reports N/v through the night, chills. Also reports finishing up antibiotics for STI. States she is still having vaginal bleeding from she had with STI. Endorses chest wall pain from excessive coughing/vomiting. Denies any other symptoms. Nad noted at time of assessment. MAE, respirations even and unlabored. Awaiting EDP eval

## 2023-03-23 NOTE — ED Notes (Signed)
Pt. Attempted to urinate, pt could not urinate at this time.

## 2023-03-23 NOTE — Discharge Instructions (Addendum)
You were seen in the emergency department today for nausea and vomiting.  As we discussed, your blood work today was reassuring. We gave you IV fluids, nausea medication, and a GI cocktail. I have sent a prescription for nausea medication to your pharmacy.   Continue to monitor how you're doing and return to the ER for new or worsening symptoms.

## 2023-03-23 NOTE — ED Provider Notes (Signed)
[place] EMERGENCY DEPARTMENT AT [facility] Provider Note   CSN: [id number] Arrival date & time: 03/23/23  4098     History  Chief Complaint  Patient presents with   Vomiting    Heather Joyce is a 32 y.o. female with history of hyperemesis who presents the emergency department complaining of nausea and vomiting starting around midnight last night.  Patient states he was feeling well earlier in the day, started vomiting profusely.  Unknown number of episodes of emesis.  Also had some chills.  Reports she is finishing antibiotics for STIs, still having some vaginal bleeding.  No abdominal pain.  Some chest pain, which she attributes to coughing and vomiting.  Denies any other symptoms.  Denies sick contacts.  HPI     Home Medications Prior to Admission medications   Medication Sig Start Date End Date Taking? Authorizing Provider  ondansetron (ZOFRAN) 4 MG tablet Take 1 tablet (4 mg total) by mouth every 6 (six) hours. 03/23/23  Yes Cassidi Modesitt T, PA-C  megestrol (MEGACE) 40 MG tablet Take 1 tablet (40 mg total) by mouth 2 (two) times daily. 03/13/23   Mardella Layman, MD  metroNIDAZOLE (FLAGYL) 500 MG tablet Take 1 tablet (500 mg total) by mouth 2 (two) times daily. 03/14/23   Lamptey, Britta Mccreedy, MD  dicyclomine (BENTYL) 10 MG capsule Take 1 capsule (10 mg total) by mouth 3 (three) times daily before meals. Patient not taking: Reported on 05/26/2019 07/25/18 05/26/19  Meryl Dare, MD  pantoprazole (PROTONIX) 40 MG tablet Take 1 tablet (40 mg total) by mouth daily. Patient not taking: Reported on 05/26/2019 08/07/18 05/26/19  Meryl Dare, MD      Allergies    Penicillins, Pineapple, and Food    Review of Systems   Review of Systems  Constitutional:  Positive for chills.  Gastrointestinal:  Positive for nausea and vomiting.  Genitourinary:  Positive for vaginal bleeding.  All other systems reviewed and are negative.   Physical Exam Updated Vital Signs BP  128/88 (BP Location: Left Arm)   Pulse 80   Temp 98.2 F (36.8 C) (Oral)   Resp 17   Ht 5\' 4"  (1.626 m)   Wt 54.4 kg   LMP 02/24/2023 (Exact Date)   SpO2 98%   BMI 20.59 kg/m  Physical Exam Vitals and nursing note reviewed.  Constitutional:      General: She is awake.     Appearance: Normal appearance.     Comments: Patient coughing and dry heaving on exam. Holding partially filled emesis bag. Sitting up in exam bed.  HENT:     Head: Normocephalic and atraumatic.  Eyes:     Conjunctiva/sclera: Conjunctivae normal.  Cardiovascular:     Rate and Rhythm: Regular rhythm. Tachycardia present.  Pulmonary:     Effort: Pulmonary effort is normal. No respiratory distress.     Breath sounds: Normal breath sounds.  Abdominal:     General: There is no distension.     Palpations: Abdomen is soft.     Tenderness: There is no abdominal tenderness.  Skin:    General: Skin is warm and dry.  Neurological:     General: No focal deficit present.     Mental Status: She is alert.     ED Results / Procedures / Treatments   Labs (all labs ordered are listed, but only abnormal results are displayed) Labs Reviewed  COMPREHENSIVE METABOLIC PANEL - Abnormal; Notable for the following components:  Result Value   CO2 17 (*)    Glucose, Bld 120 (*)    Total Protein 9.0 (*)    All other components within normal limits  CBC - Abnormal; Notable for the following components:   WBC 11.9 (*)    Platelets 482 (*)    All other components within normal limits  URINALYSIS, ROUTINE W REFLEX MICROSCOPIC - Abnormal; Notable for the following components:   Hgb urine dipstick LARGE (*)    Ketones, ur 20 (*)    Protein, ur 30 (*)    Leukocytes,Ua TRACE (*)    Bacteria, UA RARE (*)    All other components within normal limits  LIPASE, BLOOD  I-STAT BETA HCG BLOOD, ED ([facility], [facility], [facility] ONLY)    EKG None  Radiology No results found.  Procedures Procedures    Medications Ordered in  ED Medications  sodium chloride 0.9 % bolus 1,000 mL (1,000 mLs Intravenous New Bag/Given 03/23/23 0739)  metoCLOPramide (REGLAN) injection 10 mg (10 mg Intravenous Given 03/23/23 0740)  droperidol (INAPSINE) 2.5 MG/ML injection 1.25 mg (1.25 mg Intravenous Given 03/23/23 0937)  alum & mag hydroxide-simeth (MAALOX/MYLANTA) 200-200-20 MG/5ML suspension 30 mL (30 mLs Oral Given 03/23/23 0906)    And  lidocaine (XYLOCAINE) 2 % viscous mouth solution 15 mL (15 mLs Oral Given 03/23/23 1610)    ED Course/ Medical Decision Making/ A&P                             Medical Decision Making Amount and/or Complexity of Data Reviewed Labs: ordered.  Risk Prescription drug management.   This patient is a 32 y.o. female  who presents to the ED for concern of nausea and vomiting starting last night.   Differential diagnoses prior to evaluation: The emergent differential diagnosis includes, but is not limited to,  ACS/MI, Boerhaave's, DKA, elevated ICP, Ischemic bowel, Sepsis, Drug-related (toxicity, THC hyperemesis, ETOH, withdrawal), Appendicitis, Bowel obstruction, Electrolyte abnormalities, Pancreatitis, Biliary colic, Gastroenteritis, Gastroparesis, Hepatitis, Migraine, Thyroid disease, Renal colic, GERD/PUD, UTI, Ovarian torsion, Pregnancy, Hyperemesis gravidarum . This is not an exhaustive differential.   Past Medical History / Co-morbidities / Social History: Hyperemesis, GERD  Additional history: Chart reviewed. Pertinent results include: Per chart review several visits in the past for hyperemesis, thought to be related to marijuana use.  Has previously seen GI.  History of GERD, hyponatremia, hypokalemia.  Most recently seen in urgent care on 5/14 for abnormal vaginal bleeding, diagnosed with chlamydia, trichomonas, candidiasis, bacterial vaginosis.  Currently on antibiotics.  Physical Exam: Physical exam performed. The pertinent findings include: Tachycardic, afebrile. Abdomen soft without  tenderness. Hunched over partially filled emesis bag, dry heaving.  Lab Tests/Imaging studies: I personally interpreted labs/imaging and the pertinent results include:  mild leukocytosis of 11.9, normal hemoglobin. CO2 17, normal anion gap, otherwise CMP unremarkable. Normal lipase, negative pregnancy. Urinalysis with large hemoglobin, likely menstrual contaminant, with trace leukocytes, <5 WBCs, negative nitrites. Some ketones and protein, likely from dehydration.   As patient has benign abdominal exam with reassuring laboratory workup, will defer abdominal imaging at this time.   Cardiac monitoring: EKG obtained and interpreted by my attending physician which shows: sinus rhythm, QTC 443   Medications: I ordered medication including IVF, reglan, droperidol, GI cocktail.  I have reviewed the patients home medicines and have made adjustments as needed.   Disposition: After consideration of the diagnostic results and the patients response to treatment, I feel that emergency department  workup does not suggest an emergent condition requiring admission or immediate intervention beyond what has been performed at this time. The plan is: discharge to home with antiemetics, symptoms likely related to hyperemesis or viral gastroenteritis. Passed PO challenge. Labs without significant derangement. The patient is safe for discharge and has been instructed to return immediately for worsening symptoms, change in symptoms or any other concerns.  Final Clinical Impression(s) / ED Diagnoses Final diagnoses:  Nausea and vomiting, unspecified vomiting type    Rx / DC Orders ED Discharge Orders          Ordered    ondansetron (ZOFRAN) 4 MG tablet  Every 6 hours        03/23/23 1119           Portions of this report may have been transcribed using voice recognition software. Every effort was made to ensure accuracy; however, inadvertent computerized transcription errors may be present.    Jeanella Flattery 03/23/23 1119    Mardene Sayer, MD 03/23/23 1356

## 2023-05-07 ENCOUNTER — Encounter (HOSPITAL_COMMUNITY): Payer: Self-pay

## 2023-05-07 ENCOUNTER — Other Ambulatory Visit: Payer: Self-pay

## 2023-05-07 ENCOUNTER — Emergency Department (HOSPITAL_COMMUNITY): Payer: Medicaid Other

## 2023-05-07 ENCOUNTER — Observation Stay (HOSPITAL_COMMUNITY)
Admission: EM | Admit: 2023-05-07 | Discharge: 2023-05-09 | Disposition: A | Discharge status: Home / Self Care | Payer: Medicaid Other | Attending: Internal Medicine | Admitting: Internal Medicine

## 2023-05-07 DIAGNOSIS — R1115 Cyclical vomiting syndrome unrelated to migraine: Secondary | ICD-10-CM

## 2023-05-07 DIAGNOSIS — J982 Interstitial emphysema: Principal | ICD-10-CM | POA: Diagnosis present

## 2023-05-07 DIAGNOSIS — R112 Nausea with vomiting, unspecified: Secondary | ICD-10-CM | POA: Diagnosis present

## 2023-05-07 DIAGNOSIS — N179 Acute kidney failure, unspecified: Secondary | ICD-10-CM

## 2023-05-07 LAB — COMPREHENSIVE METABOLIC PANEL
ALT: 21 U/L (ref 0–44)
AST: 27 U/L (ref 15–41)
Albumin: 4.9 g/dL (ref 3.5–5.0)
Alkaline Phosphatase: 51 U/L (ref 38–126)
Anion gap: 19 — ABNORMAL HIGH (ref 5–15)
BUN: 20 mg/dL (ref 6–20)
CO2: 18 mmol/L — ABNORMAL LOW (ref 22–32)
Calcium: 10 mg/dL (ref 8.9–10.3)
Chloride: 98 mmol/L (ref 98–111)
Creatinine, Ser: 2.56 mg/dL — ABNORMAL HIGH (ref 0.44–1.00)
GFR, Estimated: 25 mL/min — ABNORMAL LOW (ref >60)
Glucose, Bld: 83 mg/dL (ref 70–99)
Potassium: 3.6 mmol/L (ref 3.5–5.1)
Sodium: 135 mmol/L (ref 135–145)
Total Bilirubin: 1.8 mg/dL — ABNORMAL HIGH (ref 0.3–1.2)
Total Protein: 8.3 g/dL — ABNORMAL HIGH (ref 6.5–8.1)

## 2023-05-07 LAB — LIPASE, BLOOD: Lipase: 37 U/L (ref 11–51)

## 2023-05-07 LAB — CBC
HCT: 43.2 % (ref 36.0–46.0)
Hemoglobin: 15.6 g/dL — ABNORMAL HIGH (ref 12.0–15.0)
MCH: 30.7 pg (ref 26.0–34.0)
MCHC: 36.1 g/dL — ABNORMAL HIGH (ref 30.0–36.0)
MCV: 85 fL (ref 80.0–100.0)
Platelets: 419 10*3/uL — ABNORMAL HIGH (ref 150–400)
RBC: 5.08 MIL/uL (ref 3.87–5.11)
RDW: 12.9 % (ref 11.5–15.5)
WBC: 20.8 10*3/uL — ABNORMAL HIGH (ref 4.0–10.5)
nRBC: 0 % (ref 0.0–0.2)

## 2023-05-07 LAB — HCG, QUANTITATIVE, PREGNANCY: hCG, Beta Chain, Quant, S: <1 m[IU]/mL (ref <5)

## 2023-05-07 MED ORDER — SENNOSIDES-DOCUSATE SODIUM 8.6-50 MG PO TABS: 1.0000 | ORAL_TABLET | Freq: Every evening | ORAL | Status: DC | PRN

## 2023-05-07 MED ORDER — HYDROMORPHONE HCL 1 MG/ML IJ SOLN
0.5000 mg | Freq: Once | INTRAMUSCULAR | Status: AC
  Administered 2023-05-07: 0.5 mg via INTRAVENOUS
  Filled 2023-05-07: qty 1

## 2023-05-07 MED ORDER — LACTATED RINGERS IV BOLUS
2000.0000 mL | Freq: Once | INTRAVENOUS | Status: AC
  Administered 2023-05-07: 2000 mL via INTRAVENOUS

## 2023-05-07 MED ORDER — ONDANSETRON HCL 4 MG/2ML IJ SOLN
4.0000 mg | Freq: Four times a day (QID) | INTRAMUSCULAR | Status: DC | PRN
  Administered 2023-05-09: 4 mg via INTRAVENOUS
  Filled 2023-05-07: qty 2

## 2023-05-07 MED ORDER — DIATRIZOATE MEGLUMINE & SODIUM 66-10 % PO SOLN
30.0000 mL | Freq: Once | ORAL | Status: DC
  Filled 2023-05-07: qty 30

## 2023-05-07 MED ORDER — SODIUM CHLORIDE 0.9% FLUSH
3.0000 mL | Freq: Two times a day (BID) | INTRAVENOUS | Status: DC
  Administered 2023-05-08 – 2023-05-09 (×3): 3 mL via INTRAVENOUS

## 2023-05-07 MED ORDER — ACETAMINOPHEN 325 MG PO TABS: 650.0000 mg | ORAL_TABLET | Freq: Four times a day (QID) | ORAL | Status: DC | PRN

## 2023-05-07 MED ORDER — ONDANSETRON HCL 4 MG/2ML IJ SOLN
4.0000 mg | Freq: Once | INTRAMUSCULAR | Status: AC | PRN
  Administered 2023-05-07: 4 mg via INTRAVENOUS
  Filled 2023-05-07: qty 2

## 2023-05-07 MED ORDER — DROPERIDOL 2.5 MG/ML IJ SOLN
2.5000 mg | Freq: Once | INTRAMUSCULAR | Status: AC
  Administered 2023-05-07: 2.5 mg via INTRAVENOUS
  Filled 2023-05-07: qty 2

## 2023-05-07 MED ORDER — HYDROMORPHONE HCL 1 MG/ML IJ SOLN: 0.5000 mg | INTRAMUSCULAR | Status: DC | PRN

## 2023-05-07 MED ORDER — LACTATED RINGERS IV SOLN: INTRAVENOUS | Status: AC

## 2023-05-07 MED ORDER — ONDANSETRON HCL 4 MG/2ML IJ SOLN: 4.0000 mg | Freq: Four times a day (QID) | INTRAMUSCULAR | Status: DC | PRN

## 2023-05-07 MED ORDER — ACETAMINOPHEN 650 MG RE SUPP: 650.0000 mg | Freq: Four times a day (QID) | RECTAL | Status: DC | PRN

## 2023-05-07 NOTE — ED Provider Notes (Signed)
[place] EMERGENCY DEPARTMENT AT [facility] Provider Note  CSN: [id number] Arrival date & time: 05/07/23 1601  Chief Complaint(s) Nausea  HPI Heather Joyce is a 32 y.o. female with history of recurrent nausea and vomiting presenting to the emergency department with nausea and vomiting.  She reports that when she has that she typically does not come to the emergency department but today her symptoms have been going on since Thursday she has not been able to keep anything down.  She reports that she feels very dehydrated.  She also reports some right upper chest pain and back pain.  She also reports some right upper abdominal pain.  No difficulty breathing, leg swelling, hematemesis, hematochezia, melena.  No fevers or chills.  No urinary symptoms.   Past Medical History Past Medical History:  Diagnosis Date   Rabies exposure    Patient Active Problem List   Diagnosis Date Noted   SVD (spontaneous vaginal delivery) 07/05/2017   Shortness of breath    [redacted] weeks gestation of pregnancy    Gastroesophageal reflux disease    Chest pain 01/02/2017   Hyponatremia 01/02/2017   Hypokalemia 01/02/2017   Pregnancy 01/02/2017   Leukocytosis 01/02/2017   Home Medication(s) Prior to Admission medications   Medication Sig Start Date End Date Taking? Authorizing Provider  megestrol (MEGACE) 40 MG tablet Take 1 tablet (40 mg total) by mouth 2 (two) times daily. 03/13/23   Mardella Layman, MD  metroNIDAZOLE (FLAGYL) 500 MG tablet Take 1 tablet (500 mg total) by mouth 2 (two) times daily. 03/14/23   Lamptey, Britta Mccreedy, MD  ondansetron (ZOFRAN) 4 MG tablet Take 1 tablet (4 mg total) by mouth every 6 (six) hours. 03/23/23   Roemhildt, Lorin T, PA-C  dicyclomine (BENTYL) 10 MG capsule Take 1 capsule (10 mg total) by mouth 3 (three) times daily before meals. Patient not taking: Reported on 05/26/2019 07/25/18 05/26/19  Meryl Dare, MD  pantoprazole (PROTONIX) 40 MG tablet Take 1 tablet (40 mg  total) by mouth daily. Patient not taking: Reported on 05/26/2019 08/07/18 05/26/19  Meryl Dare, MD                                                                                                                                    Past Surgical History Past Surgical History:  Procedure Laterality Date   DENTAL SURGERY  2011   wisdom teeth removed   Family History Family History  Problem Relation Age of Onset   Hypertension Father    Hypertension Paternal Grandfather    Esophageal cancer Paternal Grandfather    Colon cancer Neg Hx     Social History Social History   Tobacco Use   Smoking status: Never   Smokeless tobacco: Never  Vaping Use   Vaping Use: Never used  Substance Use Topics   Alcohol use: Yes    Comment: ocassionally   Drug use: Yes  Types: Marijuana   Allergies Penicillins, Pineapple, and Food  Review of Systems Review of Systems  All other systems reviewed and are negative.   Physical Exam Vital Signs  I have reviewed the triage vital signs BP 115/79   Pulse 79   Temp 97.9 F (36.6 C) (Oral)   Resp 20   Ht 5\' 4"  (1.626 m)   LMP 05/06/2023   SpO2 100%   BMI 20.59 kg/m  Physical Exam Vitals and nursing note reviewed.  Constitutional:      General: She is not in acute distress.    Appearance: She is well-developed.  HENT:     Head: Normocephalic and atraumatic.     Mouth/Throat:     Mouth: Mucous membranes are dry.  Eyes:     Pupils: Pupils are equal, round, and reactive to light.  Cardiovascular:     Rate and Rhythm: Normal rate and regular rhythm.     Heart sounds: No murmur heard. Pulmonary:     Effort: Pulmonary effort is normal. No respiratory distress.     Breath sounds: Normal breath sounds.  Abdominal:     General: Abdomen is flat.     Palpations: Abdomen is soft.     Tenderness: There is abdominal tenderness (RUQ).  Musculoskeletal:        General: No tenderness.     Right lower leg: No edema.     Left lower leg: No  edema.  Skin:    General: Skin is warm and dry.  Neurological:     General: No focal deficit present.     Mental Status: She is alert. Mental status is at baseline.  Psychiatric:        Mood and Affect: Mood normal.        Behavior: Behavior normal.     ED Results and Treatments Labs (all labs ordered are listed, but only abnormal results are displayed) Labs Reviewed  COMPREHENSIVE METABOLIC PANEL - Abnormal; Notable for the following components:      Result Value   CO2 18 (*)    Creatinine, Ser 2.56 (*)    Total Protein 8.3 (*)    Total Bilirubin 1.8 (*)    GFR, Estimated 25 (*)    Anion gap 19 (*)    All other components within normal limits  CBC - Abnormal; Notable for the following components:   WBC 20.8 (*)    Hemoglobin 15.6 (*)    MCHC 36.1 (*)    Platelets 419 (*)    All other components within normal limits  LIPASE, BLOOD  HCG, QUANTITATIVE, PREGNANCY  URINALYSIS, ROUTINE W REFLEX MICROSCOPIC                                                                                                                          Radiology CT Chest Wo Contrast  Result Date: 05/07/2023 CLINICAL DATA:  Status post trauma. EXAM: CT CHEST WITHOUT CONTRAST TECHNIQUE: Multidetector CT imaging of the chest was  performed following the standard protocol without IV contrast. RADIATION DOSE REDUCTION: This exam was performed according to the departmental dose-optimization program which includes automated exposure control, adjustment of the mA and/or kV according to patient size and/or use of iterative reconstruction technique. COMPARISON:  None Available. FINDINGS: Cardiovascular: No significant vascular findings. Normal heart size. No pericardial effusion. Mediastinum/Nodes: A very large amount of air is seen throughout the anterior and superior mediastinum. No enlarged mediastinal or axillary lymph nodes. Thyroid gland, trachea, and esophagus demonstrate no significant findings. Lungs/Pleura:  There is no evidence of acute infiltrate or pleural effusion. A very small right apical and lateral right upper lobe pneumothorax is seen. Upper Abdomen: No acute abnormality. Musculoskeletal: A very large amount of soft tissue air is seen within the bilateral neck soft tissues. A mild amount of soft tissue air is seen within the superior aspect of the anterior left chest wall. A marked amount of soft tissue air is present within the anterior and posterior aspects of the right chest wall. A mild-to-moderate amount of air is seen along the posterior and lateral aspects of the spinal cord, right greater than left. No acute osseous abnormalities are identified. IMPRESSION: 1. Very large pneumomediastinum, as described above. 2. Very small right apical and lateral right upper lobe pneumothorax. 3. Marked amount of soft tissue air within the bilateral neck soft tissues, right chest wall and to a lesser degree within the left chest wall. 4. No acute osseous abnormalities. Electronically Signed   By: Aram Candela M.D.   On: 05/07/2023 20:02   DG Chest Port 1 View  Result Date: 05/07/2023 CLINICAL DATA:  Chest pain EXAM: PORTABLE CHEST 1 VIEW COMPARISON:  03/16/2018 FINDINGS: There is pneumomediastinum and gas noted within the right chest wall and neck. No definite visible pneumothorax. Lungs clear. No effusions. Heart is normal size. No acute bony abnormality. IMPRESSION: Pneumomediastinum and subcutaneous emphysema within the neck bilaterally and right chest wall. No visible pneumothorax. These results were called by telephone at the time of interpretation on 05/07/2023 at 7:03 pm to provider Alvino Blood , who verbally acknowledged these results. Electronically Signed   By: Charlett Nose M.D.   On: 05/07/2023 19:04   CT ABDOMEN PELVIS WO CONTRAST  Result Date: 05/07/2023 CLINICAL DATA:  Abdominal pain, acute, nonlocalized EXAM: CT ABDOMEN AND PELVIS WITHOUT CONTRAST TECHNIQUE: Multidetector CT imaging of the  abdomen and pelvis was performed following the standard protocol without IV contrast. RADIATION DOSE REDUCTION: This exam was performed according to the departmental dose-optimization program which includes automated exposure control, adjustment of the mA and/or kV according to patient size and/or use of iterative reconstruction technique. COMPARISON:  05/26/2019 FINDINGS: Lower chest: Air is seen in the posterior mediastinum adjacent to the distal esophagus compatible with pneumomediastinum. No visible pneumothorax. No confluent opacities or effusions. Hepatobiliary: No focal hepatic abnormality. Gallbladder unremarkable. Pancreas: No focal abnormality or ductal dilatation. Spleen: No focal abnormality.  Normal size. Adrenals/Urinary Tract: No adrenal abnormality. No focal renal abnormality. No stones or hydronephrosis. Urinary bladder is unremarkable. Stomach/Bowel: Normal appendix. Stomach, large and small bowel grossly unremarkable. Vascular/Lymphatic: No evidence of aneurysm or adenopathy. Reproductive: Uterus and adnexa unremarkable.  No mass. Other: No free fluid or free air. Musculoskeletal: No acute bony abnormality. IMPRESSION: Pneumomediastinum seen within the lower posterior mediastinum adjacent to the distal esophagus. No acute findings in the abdomen or pelvis. These results were called by telephone at the time of interpretation on 05/07/2023 at 7:02 pm to provider [facility] ,  who verbally acknowledged these results. For Electronically Signed   By: Charlett Nose M.D.   On: 05/07/2023 19:02    Pertinent labs & imaging results that were available during my care of the patient were reviewed by me and considered in my medical decision making (see MDM for details).  Medications Ordered in ED Medications  diatrizoate meglumine-sodium (GASTROGRAFIN) 66-10 % solution 30 mL (has no administration in time range)  ondansetron (ZOFRAN) injection 4 mg (4 mg Intravenous Given 05/07/23 1628)  droperidol  (INAPSINE) 2.5 MG/ML injection 2.5 mg (2.5 mg Intravenous Given 05/07/23 1823)  HYDROmorphone (DILAUDID) injection 0.5 mg (0.5 mg Intravenous Given 05/07/23 1823)  lactated ringers bolus 2,000 mL (2,000 mLs Intravenous New Bag/Given 05/07/23 1850)                                                                                                                                     Procedures .Critical Care  Performed by: Lonell Grandchild, MD Authorized by: Lonell Grandchild, MD   Critical care provider statement:    Critical care time (minutes):  30   Critical care was necessary to treat or prevent imminent or life-threatening deterioration of the following conditions:  Respiratory failure   Critical care was time spent personally by me on the following activities:  Development of treatment plan with patient or surrogate, discussions with consultants, evaluation of patient's response to treatment, examination of patient, ordering and review of laboratory studies, ordering and review of radiographic studies, ordering and performing treatments and interventions, pulse oximetry, re-evaluation of patient's condition and review of old charts   Care discussed with: admitting provider     (including critical care time)  Medical Decision Making / ED Course   MDM:  32 year old female presenting to the emergency department vomiting.  Patient overall well-appearing, is having vomiting.  She does have some abdominal tenderness on exam.   On review of chart, patient has been seen previously cannabinoid hyperemesis syndrome.  This is possibly the cause of her symptoms today.,  Labs with significant evidence of dehydration with acute kidney injury with a creatinine of 2.56.  Pregnancy test is negative.  Patient also appears hemoconcentrated with leukocytosis and elevated hemoglobin and platelets.  She does have chest pain from retching so we will check chest x-ray to evaluate for pneumomediastinum or other  process.  Will check CT abdomen as patient does have some abdominal tenderness.  Will give fluid bolus.  Patient will need admission for acute kidney injury following results of testing.  Clinical Course as of 05/07/23 2113  Mon May 07, 2023  2058 Discussed with ICU/pulmonology who will consult regarding her PTX/pneumomediasteinum.  [WS]  2110 Discussed with Dr. Allena Katz who will admit patient to the hospital.  [WS]    Clinical Course User Index [WS] Lonell Grandchild, MD     Additional history obtained: -Additional history obtained from ems -External records from outside  source obtained and reviewed including: Chart review including previous notes, labs, imaging, consultation notes including prior er visits for cyclical vomiting syndrome   Lab Tests: -I ordered, reviewed, and interpreted labs.   The pertinent results include:   Labs Reviewed  COMPREHENSIVE METABOLIC PANEL - Abnormal; Notable for the following components:      Result Value   CO2 18 (*)    Creatinine, Ser 2.56 (*)    Total Protein 8.3 (*)    Total Bilirubin 1.8 (*)    GFR, Estimated 25 (*)    Anion gap 19 (*)    All other components within normal limits  CBC - Abnormal; Notable for the following components:   WBC 20.8 (*)    Hemoglobin 15.6 (*)    MCHC 36.1 (*)    Platelets 419 (*)    All other components within normal limits  LIPASE, BLOOD  HCG, QUANTITATIVE, PREGNANCY  URINALYSIS, ROUTINE W REFLEX MICROSCOPIC    Notable for leukocytosis, AKI, acidosis  EKG   EKG Interpretation Date/Time:  Monday May 07 2023 16:21:02 EDT Ventricular Rate:  120 PR Interval:  146 QRS Duration:  72 QT Interval:  316 QTC Calculation: 446 R Axis:   105  Text Interpretation: Sinus tachycardia Right atrial enlargement Rightward axis Anterior infarct , age undetermined T wave abnormality, consider inferior ischemia Abnormal ECG When compared with ECG of 23-Mar-2023 08:14, inferior T wave abnormality is new Confirmed by  Alvino Blood ([id number]) on 05/07/2023 6:14:59 PM         Imaging Studies ordered: I ordered imaging studies including CT chest, CT abdomen On my interpretation imaging demonstrates pneumomediasteinum  I independently visualized and interpreted imaging. I agree with the radiologist interpretation   Medicines ordered and prescription drug management: Meds ordered this encounter  Medications   ondansetron (ZOFRAN) injection 4 mg   droperidol (INAPSINE) 2.5 MG/ML injection 2.5 mg   HYDROmorphone (DILAUDID) injection 0.5 mg   lactated ringers bolus 2,000 mL   diatrizoate meglumine-sodium (GASTROGRAFIN) 66-10 % solution 30 mL    -I have reviewed the patients home medicines and have made adjustments as needed   Consultations Obtained: I requested consultation with the pulmonologist,  and discussed lab and imaging findings as well as pertinent plan - they recommend: admission   Cardiac Monitoring: The patient was maintained on a cardiac monitor.  I personally viewed and interpreted the cardiac monitored which showed an underlying rhythm of: NSR  Social Determinants of Health:  Diagnosis or treatment significantly limited by social determinants of health: marijuana use   Reevaluation: After the interventions noted above, I reevaluated the patient and found that their symptoms have improved  Co morbidities that complicate the patient evaluation  Past Medical History:  Diagnosis Date   Rabies exposure       Dispostion: Disposition decision including need for hospitalization was considered, and patient admitted to the hospital.    Final Clinical Impression(s) / ED Diagnoses Final diagnoses:  AKI (acute kidney injury) (HCC)  Pneumomediastinum (HCC)  Cyclical vomiting     This chart was dictated using voice recognition software.  Despite best efforts to proofread,  errors can occur which can change the documentation meaning.    Lonell Grandchild, MD 05/07/23  2113

## 2023-05-07 NOTE — ED Triage Notes (Signed)
Patient arrives via EMS for N/V since last Thursday. Patient is from home. Patient states today she had dizziness and weakness with irregular heart rate. EMS states her heart rate increases and then drops. States she is also orthostatic. Patient has pain in the right rib. Patient states she also has a tickle in her upper chest and throat.

## 2023-05-07 NOTE — Hospital Course (Signed)
Heather Joyce is a 32 y.o. female with medical history significant for marijuana use and cyclical vomiting syndrome who is admitted with an large pneumomediastinum likely provoked by cyclical vomiting.  Also with AKI due to dehydration.

## 2023-05-07 NOTE — Progress Notes (Signed)
Brief PCCM progress note:  Contacted by ED provider.  Cyclical vomiting.  Chest x-ray reviewed with pneumomediastinum, no pneumothorax seen.  CT chest obtained.  Tiniest of right apical pneumothorax seen after multiple reviews of film, significant pneumomediastinum.  No evidence of esophageal perforation.  No role for chest tube given no significant pneumothorax, no way to put the tube.  Likely pneumomediastinum and pneumothorax as a result of wide pressure swings associated with cyclical vomiting.  Recommend patient be placed on supplemental oxygen with goal oxygen saturation of 100% overnight.  Formal pulmonary consult to follow in the morning.

## 2023-05-07 NOTE — H&P (Signed)
History and Physical    Heather Joyce JWJ:[id number] DOB: [birth year] DOA: 05/07/2023  PCP: Patient, No Pcp Per  Patient coming from: Home  I have personally briefly reviewed patient's old medical records in [facility]  Chief Complaint: Nausea, vomiting, dizziness  HPI: Heather Joyce is a 32 y.o. female with medical history significant for marijuana use and cyclical vomiting syndrome who presented to the ED for evaluation of frequent nausea and vomiting.  Patient states that she has been having frequent nausea and vomiting since this past Thursday 7/4.  She has not had any bloody emesis.  Today she developed pain involving her right upper chest and back as well as the right neck area.  She has had some shortness of breath as well as cough with deep inspiration.  She has been feeling very dehydrated and reports diminished urine output.  Her nausea and vomiting have improved since receiving IV droperidol and she is asking to try to eat.  She states that she last used marijuana about 1 month ago.  ED Course  Labs/Imaging on admission: I have personally reviewed following labs and imaging studies.  Initial vital showed BP 101/86, pulse 86, RR 16, temp 98.1 F, SpO2 100% on room air.  Sodium 135 potassium 3.6, bicarb 18, BUN 20, creatinine 2.56 (0.81 on 03/23/2023), serum glucose 83, lipase 37, WBC 20.8, hemoglobin 15.6, platelets 419,000.  Beta-hCG <1.  CT abdomen/pelvis without contrast showed pneumomediastinum within the lower posterior mediastinum adjacent to the distal esophagus otherwise no acute findings.  CT chest shows large pneumomediastinum throughout anterior and superior mediastinum.  Trachea and esophagus with no significant findings.  Small right apical and lateral right upper lobe pneumothorax noted.  Marked amount of soft tissue air within the bilateral neck soft tissues, right chest wall, and to a lesser degree within the left chest wall.  No acute osseous  abnormalities.  Patient was given 2 L LR, IV Dilaudid 0.5 mg, IV Zofran, IV droperidol 2.5 mg.  PCCM were consulted and recommended medical admission and supplemental oxygen to maintain goal O2 sat 100% overnight.  The hospitalist service was consulted to admit for further evaluation and management.  Review of Systems: All systems reviewed and are negative except as documented in history of present illness above.   Past Medical History:  Diagnosis Date   Rabies exposure     Past Surgical History:  Procedure Laterality Date   DENTAL SURGERY  2011   wisdom teeth removed    Social History:  reports that she has never smoked. She has never used smokeless tobacco. She reports current alcohol use. She reports current drug use. Drug: Marijuana.  Allergies  Allergen Reactions   Penicillins Anaphylaxis and Other (See Comments)    Has patient had a PCN reaction causing immediate rash, facial/tongue/throat swelling, SOB or lightheadedness with hypotension: Yes Has patient had a PCN reaction causing severe rash involving mucus membranes or skin necrosis: No Has patient had a PCN reaction that required hospitalization: No Has patient had a PCN reaction occurring within the last 10 years: No If all of the above answers are "NO", then may proceed with Cephalosporin use.    Pineapple Anaphylaxis   Food Rash and Other (See Comments)    Pt is allergic to peanuts    Family History  Problem Relation Age of Onset   Hypertension Father    Hypertension Paternal Grandfather    Esophageal cancer Paternal Grandfather    Colon cancer Neg Hx  Prior to Admission medications   Medication Sig Start Date End Date Taking? Authorizing Provider  megestrol (MEGACE) 40 MG tablet Take 1 tablet (40 mg total) by mouth 2 (two) times daily. 03/13/23   Mardella Layman, MD  metroNIDAZOLE (FLAGYL) 500 MG tablet Take 1 tablet (500 mg total) by mouth 2 (two) times daily. 03/14/23   Lamptey, Britta Mccreedy, MD   ondansetron (ZOFRAN) 4 MG tablet Take 1 tablet (4 mg total) by mouth every 6 (six) hours. 03/23/23   Roemhildt, Lorin T, PA-C  dicyclomine (BENTYL) 10 MG capsule Take 1 capsule (10 mg total) by mouth 3 (three) times daily before meals. Patient not taking: Reported on 05/26/2019 07/25/18 05/26/19  Meryl Dare, MD  pantoprazole (PROTONIX) 40 MG tablet Take 1 tablet (40 mg total) by mouth daily. Patient not taking: Reported on 05/26/2019 08/07/18 05/26/19  Meryl Dare, MD    Physical Exam: Vitals:   05/07/23 2015 05/07/23 2029 05/07/23 2030 05/07/23 2045  BP: 118/89  113/80 115/79  Pulse: 65  73 79  Resp: 12  11 20   Temp:  97.9 F (36.6 C)    TempSrc:  Oral    SpO2: 98%  98% 100%  Height:       Constitutional: Resting in bed, NAD, calm, comfortable Eyes: EOMI, lids and conjunctivae normal ENMT: Mucous membranes are dry. Posterior pharynx clear of any exudate or lesions.Normal dentition.  Neck: normal, supple, no masses. Respiratory: clear to auscultation bilaterally, no wheezing, no crackles. Normal respiratory effort. No accessory muscle use.  Cardiovascular: Regular rate and rhythm, no murmurs / rubs / gallops. No extremity edema. 2+ pedal pulses. Abdomen: no tenderness, no masses palpated.  Musculoskeletal: no clubbing / cyanosis. No joint deformity upper and lower extremities. Good ROM, no contractures. Normal muscle tone.  Skin: no rashes, lesions, ulcers. No induration Neurologic: Sensation intact. Strength 5/5 in all 4.  Psychiatric: Normal judgment and insight. Alert and oriented x 3. Normal mood.   EKG: Personally reviewed. Sinus tachycardia, rate 120, T wave inversion inferior leads.  Previous EKG showed sinus rhythm, T wave changes new compared to prior.  Assessment/Plan Principal Problem:   Pneumomediastinum (HCC) Active Problems:   AKI (acute kidney injury) (HCC)   Cyclical vomiting syndrome   Heather Joyce is a 32 y.o. female with medical history significant  for marijuana use and cyclical vomiting syndrome who is admitted with an large pneumomediastinum likely provoked by cyclical vomiting.  Also with AKI due to dehydration.  Assessment and Plan: Large pneumomediastinum Small right apical pneumothorax: Likely provoked from cyclical vomiting.  PCCM aware and to follow, no role for chest tube.  Recommendation is to place on supplemental O2 to maintain SpO2 100%.  Acute kidney injury: Creatinine 2.56 on admission compared to 0.81 on 03/23/2023.  Secondary to volume depletion from GI losses.  Continue IV fluid hydration and repeat labs in AM.  Cyclical vomiting syndrome: Symptoms improved after receiving IV droperidol.  She is asking to advance diet.  Will advance as tolerated, n.p.o. if any recurrent nausea or vomiting develops.  Antiemetics as needed.  Leukocytosis: Likely reactive and due to hemoconcentration.  No obvious infection.   DVT prophylaxis: SCDs Start: 05/07/23 2122 Code Status: Full code Family Communication: Discussed with patient, she has discussed with family Disposition Plan: From home and likely discharge to home pending clinical progress. Consults called: PCCM Severity of Illness: The appropriate patient status for this patient is OBSERVATION. Observation status is judged to be reasonable and necessary in  order to provide the required intensity of service to ensure the patient's safety. The patient's presenting symptoms, physical exam findings, and initial radiographic and laboratory data in the context of their medical condition is felt to place them at decreased risk for further clinical deterioration. Furthermore, it is anticipated that the patient will be medically stable for discharge from the hospital within 2 midnights of admission.   Darreld Mclean MD [facility]  If 7PM-7AM, please contact night-coverage [email or web address]  05/07/2023, 9:34 PM

## 2023-05-08 DIAGNOSIS — J982 Interstitial emphysema: Secondary | ICD-10-CM | POA: Diagnosis not present

## 2023-05-08 DIAGNOSIS — J9311 Primary spontaneous pneumothorax: Secondary | ICD-10-CM | POA: Diagnosis not present

## 2023-05-08 LAB — CBC
HCT: 35.2 % — ABNORMAL LOW (ref 36.0–46.0)
Hemoglobin: 12.5 g/dL (ref 12.0–15.0)
MCH: 30.6 pg (ref 26.0–34.0)
MCHC: 35.5 g/dL (ref 30.0–36.0)
MCV: 86.3 fL (ref 80.0–100.0)
Platelets: 329 10*3/uL (ref 150–400)
RBC: 4.08 MIL/uL (ref 3.87–5.11)
RDW: 13 % (ref 11.5–15.5)
WBC: 17.1 10*3/uL — ABNORMAL HIGH (ref 4.0–10.5)
nRBC: 0 % (ref 0.0–0.2)

## 2023-05-08 LAB — COMPREHENSIVE METABOLIC PANEL
ALT: 14 U/L (ref 0–44)
AST: 17 U/L (ref 15–41)
Albumin: 3.7 g/dL (ref 3.5–5.0)
Alkaline Phosphatase: 42 U/L (ref 38–126)
Anion gap: 12 (ref 5–15)
BUN: 18 mg/dL (ref 6–20)
CO2: 23 mmol/L (ref 22–32)
Calcium: 8.9 mg/dL (ref 8.9–10.3)
Chloride: 98 mmol/L (ref 98–111)
Creatinine, Ser: 1.26 mg/dL — ABNORMAL HIGH (ref 0.44–1.00)
GFR, Estimated: 59 mL/min — ABNORMAL LOW (ref >60)
Glucose, Bld: 124 mg/dL — ABNORMAL HIGH (ref 70–99)
Potassium: 3.2 mmol/L — ABNORMAL LOW (ref 3.5–5.1)
Sodium: 133 mmol/L — ABNORMAL LOW (ref 135–145)
Total Bilirubin: 1.1 mg/dL (ref 0.3–1.2)
Total Protein: 6.6 g/dL (ref 6.5–8.1)

## 2023-05-08 LAB — URINALYSIS, ROUTINE W REFLEX MICROSCOPIC
Bacteria, UA: NONE SEEN
Bilirubin Urine: NEGATIVE
Glucose, UA: NEGATIVE mg/dL
Ketones, ur: NEGATIVE mg/dL
Leukocytes,Ua: NEGATIVE
Nitrite: NEGATIVE
Protein, ur: 30 mg/dL — AB
Specific Gravity, Urine: 1.017 (ref 1.005–1.030)
pH: 6 (ref 5.0–8.0)

## 2023-05-08 LAB — MRSA NEXT GEN BY PCR, NASAL: MRSA by PCR Next Gen: NOT DETECTED

## 2023-05-08 MED ORDER — PIPERACILLIN-TAZOBACTAM 3.375 G IVPB
3.3750 g | Freq: Three times a day (TID) | INTRAVENOUS | Status: DC
  Administered 2023-05-08 – 2023-05-09 (×3): 3.375 g via INTRAVENOUS
  Filled 2023-05-08 (×5): qty 50

## 2023-05-08 MED ORDER — POTASSIUM CHLORIDE CRYS ER 20 MEQ PO TBCR
40.0000 meq | EXTENDED_RELEASE_TABLET | Freq: Once | ORAL | Status: AC
  Administered 2023-05-08: 40 meq via ORAL
  Filled 2023-05-08: qty 2

## 2023-05-08 NOTE — ED Notes (Signed)
Breakfast ordered 

## 2023-05-08 NOTE — Progress Notes (Signed)
Pharmacy Antibiotic Note  ZARRIAH STARKEL is a 32 y.o. female admitted on 05/07/2023 with mediastinitis. Of note, patient has penicillin allergy documented in the chart but reaction > 10 years ago and has tolerated augmentin previously. Pharmacy has been consulted for zosyn dosing.  Plan: Zosyn 3.375g q8h  F/u clinical course, dose per renal function and length of therapy  Height: 5\' 4"  (162.6 cm) Weight: 54.4 kg (119 lb 14.9 oz) IBW/kg (Calculated) : 54.7  Temp (24hrs), Avg:98.3 F (36.8 C), Min:97.9 F (36.6 C), Max:98.6 F (37 C)  Recent Labs  Lab 05/07/23 1620 05/08/23 0127  WBC 20.8* 17.1*  CREATININE 2.56* 1.26*    Estimated Creatinine Clearance: 56.6 mL/min (A) (by C-G formula based on SCr of 1.26 mg/dL (H)).    Allergies  Allergen Reactions   Penicillins Anaphylaxis and Other (See Comments)    Has patient had a PCN reaction causing immediate rash, facial/tongue/throat swelling, SOB or lightheadedness with hypotension: Yes Has patient had a PCN reaction causing severe rash involving mucus membranes or skin necrosis: No Has patient had a PCN reaction that required hospitalization: No Has patient had a PCN reaction occurring within the last 10 years: No If all of the above answers are "NO", then may proceed with Cephalosporin use.    Pineapple Anaphylaxis   Food Rash and Other (See Comments)    Pt is allergic to peanuts    Antimicrobials this admission: Zosyn 7/9 >  Dose adjustments this admission:  Microbiology results:  Thank you for allowing pharmacy to be a part of this patient's care.  Marja Kays 05/08/2023 1:29 PM

## 2023-05-08 NOTE — ED Notes (Signed)
Patient ambulated the restroom independently

## 2023-05-08 NOTE — Consult Note (Signed)
NAME:  Heather Joyce, MRN:  [id number], DOB:  [birth year], LOS: 0 ADMISSION DATE:  05/07/2023, CONSULTATION DATE: 05/08/2023 REFERRING MD: Emergency department physician, CHIEF COMPLAINT: Right pneumomediastinum and right pneumothorax  History of Present Illness:  32 year old female who has a history of cyclic vomiting that started when she was pregnant with no rhyme or reason for when it restarts.  He has been followed at [facility] GI service but says she can never get an appointment there and is looking for somewhere else to go.  She presents to [facility] emergency department with cyclic vomiting chest x-ray and CT scan revealed pneumomediastinum and right pneumothorax with subcutaneous emphysema on the right.  Pulmonary critical care asked to evaluate.  History of marijuana use.  Pertinent  Medical History   Past Medical History:  Diagnosis Date   Rabies exposure      Significant Hospital Events: Including procedures, antibiotic start and stop dates in addition to other pertinent events     Interim History / Subjective:  Still complains of pain in the right neck  Objective   Blood pressure 112/75, pulse 78, temperature 98.6 F (37 C), resp. rate 13, height 5\' 4"  (1.626 m), last menstrual period 05/06/2023, SpO2 100 %.       No intake or output data in the 24 hours ending 05/08/23 1109 There were no vitals filed for this visit.  Examination: General: 32 year old female in no acute distress although she is somewhat anxious HENT: Subcutaneous emphysema noted over right supraclavicular areas neck, painful to palpitation Lungs: Clear to auscultation Cardiovascular: Heart sounds are regular Abdomen: Soft nontender Extremities: Warm without edema Neuro: Grossly intact GU: Not observed  Resolved Hospital Problem list     Assessment & Plan:  Cyclic vomiting with pneumomediastinum, small right pneumothorax and subcutaneous emphysema noted on the right. Consider oxygen to keep sats   100% Antiemetics as needed Serial chest x-ray Monitor in the hospital for at least 24 hours  Best Practice (right click and "Reselect all SmartList Selections" daily)   Diet/type: Regular consistency (see orders) DVT prophylaxis: not indicated GI prophylaxis: PPI Lines: N/A Foley:  N/A Code Status:  full code Last date of multidisciplinary goals of care discussion [tbd]  Labs   CBC: Recent Labs  Lab 05/07/23 1620 05/08/23 0127  WBC 20.8* 17.1*  HGB 15.6* 12.5  HCT 43.2 35.2*  MCV 85.0 86.3  PLT 419* 329    Basic Metabolic Panel: Recent Labs  Lab 05/07/23 1620 05/08/23 0127  NA 135 133*  K 3.6 3.2*  CL 98 98  CO2 18* 23  GLUCOSE 83 124*  BUN 20 18  CREATININE 2.56* 1.26*  CALCIUM 10.0 8.9   GFR: CrCl cannot be calculated (Unknown ideal weight.). Recent Labs  Lab 05/07/23 1620 05/08/23 0127  WBC 20.8* 17.1*    Liver Function Tests: Recent Labs  Lab 05/07/23 1620 05/08/23 0127  AST 27 17  ALT 21 14  ALKPHOS 51 42  BILITOT 1.8* 1.1  PROT 8.3* 6.6  ALBUMIN 4.9 3.7   Recent Labs  Lab 05/07/23 1620  LIPASE 37   No results for input(s): "AMMONIA" in the last 168 hours.  ABG    Component Value Date/Time   TCO2 20 11/08/2016 2213     Coagulation Profile: No results for input(s): "INR", "PROTIME" in the last 168 hours.  Cardiac Enzymes: No results for input(s): "CKTOTAL", "CKMB", "CKMBINDEX", "TROPONINI" in the last 168 hours.  HbA1C: No results found for: "HGBA1C"  CBG: No results for  input(s): "GLUCAP" in the last 168 hours.  Review of Systems:   10 point review of system taken, please see HPI for positives and negatives.   Past Medical History:  She,  has a past medical history of Rabies exposure.   Surgical History:   Past Surgical History:  Procedure Laterality Date   DENTAL SURGERY  2011   wisdom teeth removed     Social History:   reports that she has never smoked. She has never used smokeless tobacco. She reports  current alcohol use. She reports current drug use. Drug: Marijuana.   Family History:  Her family history includes Esophageal cancer in her paternal grandfather; Hypertension in her father and paternal grandfather. There is no history of Colon cancer.   Allergies Allergies  Allergen Reactions   Penicillins Anaphylaxis and Other (See Comments)    Has patient had a PCN reaction causing immediate rash, facial/tongue/throat swelling, SOB or lightheadedness with hypotension: Yes Has patient had a PCN reaction causing severe rash involving mucus membranes or skin necrosis: No Has patient had a PCN reaction that required hospitalization: No Has patient had a PCN reaction occurring within the last 10 years: No If all of the above answers are "NO", then may proceed with Cephalosporin use.    Pineapple Anaphylaxis   Food Rash and Other (See Comments)    Pt is allergic to peanuts     Home Medications  Prior to Admission medications   Medication Sig Start Date End Date Taking? Authorizing Provider  megestrol (MEGACE) 40 MG tablet Take 1 tablet (40 mg total) by mouth 2 (two) times daily. Patient not taking: Reported on 05/07/2023 03/13/23   Mardella Layman, MD  metroNIDAZOLE (FLAGYL) 500 MG tablet Take 1 tablet (500 mg total) by mouth 2 (two) times daily. Patient not taking: Reported on 05/07/2023 03/14/23   Merrilee Jansky, MD  ondansetron (ZOFRAN) 4 MG tablet Take 1 tablet (4 mg total) by mouth every 6 (six) hours. Patient not taking: Reported on 05/07/2023 03/23/23   Roemhildt, Lorin T, PA-C  dicyclomine (BENTYL) 10 MG capsule Take 1 capsule (10 mg total) by mouth 3 (three) times daily before meals. Patient not taking: Reported on 05/26/2019 07/25/18 05/26/19  Meryl Dare, MD  pantoprazole (PROTONIX) 40 MG tablet Take 1 tablet (40 mg total) by mouth daily. Patient not taking: Reported on 05/26/2019 08/07/18 05/26/19  Meryl Dare, MD     Critical care time: 23 min    Brett Canales Yadira Hada ACNP Acute  Care Nurse Practitioner [facility] Pulmonary/Critical Care Please consult Amion 05/08/2023, 11:09 AM

## 2023-05-08 NOTE — Progress Notes (Signed)
PROGRESS NOTE    Heather Joyce  ZOX:[id number] DOB: 1991/02/19 DOA: 05/07/2023 PCP: Patient, No Pcp Per   Brief Narrative:    Heather Joyce is a 32 y.o. female with medical history significant for marijuana use and cyclical vomiting syndrome who is admitted with an large pneumomediastinum likely provoked by cyclical vomiting.  Also with AKI due to dehydration which is now improving. Pulmonology following with plans for repeat CXR in am. Follow am labs.  Assessment & Plan:   Principal Problem:   Pneumomediastinum (HCC) Active Problems:   AKI (acute kidney injury) (HCC)   Cyclical vomiting syndrome  Assessment and Plan:   Large pneumomediastinum Small right apical pneumothorax: Likely provoked from cyclical vomiting.  PCCM aware and to follow, no role for chest tube.  Recommendation is to place on supplemental O2 to maintain SpO2 100%. -PCCM to repeat AM CXR   Acute kidney injury-resolving Creatinine 2.56 on admission compared to 0.81 on 03/23/2023.  Secondary to volume depletion from GI losses.   -Hold further IVF and monitor AM labs   Cyclical vomiting syndrome: Received IV droperidol with no further symptoms Tolerating diet   Leukocytosis: Likely reactive and due to hemoconcentration.  No obvious infection. Follow in am  Mild hypokalemia Replete and follow in am   DVT prophylaxis:SCDs Code Status: Full Family Communication: SO at bedside Disposition Plan:  Status is: Observation The patient remains OBS appropriate and will d/c before 2 midnights.   Consultants:  PCCM  Procedures:  None  Antimicrobials:  None   Subjective: Patient seen and evaluated today with no new acute complaints or concerns. No acute concerns or events noted overnight.  She denies any further nausea or vomiting and is now tolerating breakfast.  Eager to go home once able.  Objective: Vitals:   05/08/23 0600 05/08/23 0700 05/08/23 0815 05/08/23 1113  BP: (!) 123/109 112/66  112/75 110/70  Pulse: 90 86 78 78  Resp: 18 17 13 16   Temp:   98.6 F (37 C) 98.2 F (36.8 C)  TempSrc:      SpO2: 99% 100% 100% 100%  Height:       No intake or output data in the 24 hours ending 05/08/23 1144 There were no vitals filed for this visit.  Examination:  General exam: Appears calm and comfortable  Respiratory system: Clear to auscultation. Respiratory effort normal. Cardiovascular system: S1 & S2 heard, RRR.  Gastrointestinal system: Abdomen is soft Central nervous system: Alert and awake Extremities: No edema Skin: No significant lesions noted Psychiatry: Flat affect.    Data Reviewed: I have personally reviewed following labs and imaging studies  CBC: Recent Labs  Lab 05/07/23 1620 05/08/23 0127  WBC 20.8* 17.1*  HGB 15.6* 12.5  HCT 43.2 35.2*  MCV 85.0 86.3  PLT 419* 329   Basic Metabolic Panel: Recent Labs  Lab 05/07/23 1620 05/08/23 0127  NA 135 133*  K 3.6 3.2*  CL 98 98  CO2 18* 23  GLUCOSE 83 124*  BUN 20 18  CREATININE 2.56* 1.26*  CALCIUM 10.0 8.9   GFR: CrCl cannot be calculated (Unknown ideal weight.). Liver Function Tests: Recent Labs  Lab 05/07/23 1620 05/08/23 0127  AST 27 17  ALT 21 14  ALKPHOS 51 42  BILITOT 1.8* 1.1  PROT 8.3* 6.6  ALBUMIN 4.9 3.7   Recent Labs  Lab 05/07/23 1620  LIPASE 37   No results for input(s): "AMMONIA" in the last 168 hours. Coagulation Profile: No results for input(s): "  INR", "PROTIME" in the last 168 hours. Cardiac Enzymes: No results for input(s): "CKTOTAL", "CKMB", "CKMBINDEX", "TROPONINI" in the last 168 hours. BNP (last 3 results) No results for input(s): "PROBNP" in the last 8760 hours. HbA1C: No results for input(s): "HGBA1C" in the last 72 hours. CBG: No results for input(s): "GLUCAP" in the last 168 hours. Lipid Profile: No results for input(s): "CHOL", "HDL", "LDLCALC", "TRIG", "CHOLHDL", "LDLDIRECT" in the last 72 hours. Thyroid Function Tests: No results for  input(s): "TSH", "T4TOTAL", "FREET4", "T3FREE", "THYROIDAB" in the last 72 hours. Anemia Panel: No results for input(s): "VITAMINB12", "FOLATE", "FERRITIN", "TIBC", "IRON", "RETICCTPCT" in the last 72 hours. Sepsis Labs: No results for input(s): "PROCALCITON", "LATICACIDVEN" in the last 168 hours.  No results found for this or any previous visit (from the past 240 hour(s)).       Radiology Studies: CT Chest Wo Contrast  Result Date: 05/07/2023 CLINICAL DATA:  Status post trauma. EXAM: CT CHEST WITHOUT CONTRAST TECHNIQUE: Multidetector CT imaging of the chest was performed following the standard protocol without IV contrast. RADIATION DOSE REDUCTION: This exam was performed according to the departmental dose-optimization program which includes automated exposure control, adjustment of the mA and/or kV according to patient size and/or use of iterative reconstruction technique. COMPARISON:  None Available. FINDINGS: Cardiovascular: No significant vascular findings. Normal heart size. No pericardial effusion. Mediastinum/Nodes: A very large amount of air is seen throughout the anterior and superior mediastinum. No enlarged mediastinal or axillary lymph nodes. Thyroid gland, trachea, and esophagus demonstrate no significant findings. Lungs/Pleura: There is no evidence of acute infiltrate or pleural effusion. A very small right apical and lateral right upper lobe pneumothorax is seen. Upper Abdomen: No acute abnormality. Musculoskeletal: A very large amount of soft tissue air is seen within the bilateral neck soft tissues. A mild amount of soft tissue air is seen within the superior aspect of the anterior left chest wall. A marked amount of soft tissue air is present within the anterior and posterior aspects of the right chest wall. A mild-to-moderate amount of air is seen along the posterior and lateral aspects of the spinal cord, right greater than left. No acute osseous abnormalities are identified.  IMPRESSION: 1. Very large pneumomediastinum, as described above. 2. Very small right apical and lateral right upper lobe pneumothorax. 3. Marked amount of soft tissue air within the bilateral neck soft tissues, right chest wall and to a lesser degree within the left chest wall. 4. No acute osseous abnormalities. Electronically Signed   By: Aram Candela M.D.   On: 05/07/2023 20:02   DG Chest Port 1 View  Result Date: 05/07/2023 CLINICAL DATA:  Chest pain EXAM: PORTABLE CHEST 1 VIEW COMPARISON:  03/16/2018 FINDINGS: There is pneumomediastinum and gas noted within the right chest wall and neck. No definite visible pneumothorax. Lungs clear. No effusions. Heart is normal size. No acute bony abnormality. IMPRESSION: Pneumomediastinum and subcutaneous emphysema within the neck bilaterally and right chest wall. No visible pneumothorax. These results were called by telephone at the time of interpretation on 05/07/2023 at 7:03 pm to provider Alvino Blood , who verbally acknowledged these results. Electronically Signed   By: Charlett Nose M.D.   On: 05/07/2023 19:04   CT ABDOMEN PELVIS WO CONTRAST  Result Date: 05/07/2023 CLINICAL DATA:  Abdominal pain, acute, nonlocalized EXAM: CT ABDOMEN AND PELVIS WITHOUT CONTRAST TECHNIQUE: Multidetector CT imaging of the abdomen and pelvis was performed following the standard protocol without IV contrast. RADIATION DOSE REDUCTION: This exam was performed according  to the departmental dose-optimization program which includes automated exposure control, adjustment of the mA and/or kV according to patient size and/or use of iterative reconstruction technique. COMPARISON:  05/26/2019 FINDINGS: Lower chest: Air is seen in the posterior mediastinum adjacent to the distal esophagus compatible with pneumomediastinum. No visible pneumothorax. No confluent opacities or effusions. Hepatobiliary: No focal hepatic abnormality. Gallbladder unremarkable. Pancreas: No focal abnormality or  ductal dilatation. Spleen: No focal abnormality.  Normal size. Adrenals/Urinary Tract: No adrenal abnormality. No focal renal abnormality. No stones or hydronephrosis. Urinary bladder is unremarkable. Stomach/Bowel: Normal appendix. Stomach, large and small bowel grossly unremarkable. Vascular/Lymphatic: No evidence of aneurysm or adenopathy. Reproductive: Uterus and adnexa unremarkable.  No mass. Other: No free fluid or free air. Musculoskeletal: No acute bony abnormality. IMPRESSION: Pneumomediastinum seen within the lower posterior mediastinum adjacent to the distal esophagus. No acute findings in the abdomen or pelvis. These results were called by telephone at the time of interpretation on 05/07/2023 at 7:02 pm to provider [facility] , who verbally acknowledged these results. For Electronically Signed   By: Charlett Nose M.D.   On: 05/07/2023 19:02        Scheduled Meds:  diatrizoate meglumine-sodium  30 mL Oral Once   sodium chloride flush  3 mL Intravenous Q12H     LOS: 0 days    Time spent: 35 minutes    Joas Motton Hoover Brunette, DO [facility]  If 7PM-7AM, please contact night-coverage [email or web address] 05/08/2023, 11:44 AM

## 2023-05-08 NOTE — ED Notes (Signed)
ED TO INPATIENT HANDOFF REPORT  ED Nurse Name and Phone #: Krystalynn Ridgeway Dahlia Client [phone]  S Name/Age/Gender Heather Joyce 32 y.o. female Room/Bed: 003C/003C  Code Status   Code Status: Full Code  Home/SNF/Other Home Patient oriented to: self, place, time, and situation Is this baseline? Yes   Triage Complete: Triage complete  Chief Complaint Pneumomediastinum [facility]) [J98.2]  Triage Note Patient arrives via EMS for N/V since last Thursday. Patient is from home. Patient states today she had dizziness and weakness with irregular heart rate. EMS states her heart rate increases and then drops. States she is also orthostatic. Patient has pain in the right rib. Patient states she also has a tickle in her upper chest and throat.    Allergies Allergies  Allergen Reactions   Penicillins Anaphylaxis and Other (See Comments)    Has patient had a PCN reaction causing immediate rash, facial/tongue/throat swelling, SOB or lightheadedness with hypotension: Yes Has patient had a PCN reaction causing severe rash involving mucus membranes or skin necrosis: No Has patient had a PCN reaction that required hospitalization: No Has patient had a PCN reaction occurring within the last 10 years: No If all of the above answers are "NO", then may proceed with Cephalosporin use.    Pineapple Anaphylaxis   Food Rash and Other (See Comments)    Pt is allergic to peanuts    Level of Care/Admitting Diagnosis ED Disposition     ED Disposition  Admit   Condition  --   Comment  Hospital Area: [facility] [[id number]]  Level of Care: Progressive [102]  Admit to Progressive based on following criteria: RESPIRATORY PROBLEMS hypoxemic/hypercapnic respiratory failure that is responsive to NIPPV (BiPAP) or High Flow Nasal Cannula (6-80 lpm). Frequent assessment/intervention, no > Q2 hrs < Q4 hrs, to maintain oxygenation and pulmonary hygiene.  May place patient in observation at [facility] or  [facility] if equivalent level of care is available:: No  Covid Evaluation: Asymptomatic - no recent exposure (last 10 days) testing not required  Diagnosis: Pneumomediastinum [facility]) [[id number]]  Admitting Physician: Charlsie Quest [[id number]]  Attending Physician: Charlsie Quest [[id number]]          B Medical/Surgery History Past Medical History:  Diagnosis Date   Rabies exposure    Past Surgical History:  Procedure Laterality Date   DENTAL SURGERY  2011   wisdom teeth removed     A IV Location/Drains/Wounds Patient Lines/Drains/Airways Status     Active Line/Drains/Airways     Name Placement date Placement time Site Days   Peripheral IV 05/07/23 18 G Anterior;Left Hand 05/07/23  1617  Hand  1            Intake/Output Last 24 hours No intake or output data in the 24 hours ending 05/08/23 1249  Labs/Imaging Results for orders placed or performed during the hospital encounter of 05/07/23 (from the past 48 hour(s))  Lipase, blood     Status: None   Collection Time: 05/07/23  4:20 PM  Result Value Ref Range   Lipase 37 11 - 51 U/L    Comment: Performed at [facility], [address], [place], [place] [address]  Comprehensive metabolic panel     Status: Abnormal   Collection Time: 05/07/23  4:20 PM  Result Value Ref Range   Sodium 135 135 - 145 mmol/L   Potassium 3.6 3.5 - 5.1 mmol/L    Comment: HEMOLYSIS AT THIS LEVEL MAY AFFECT RESULT  Chloride 98 98 - 111 mmol/L   CO2 18 (L) 22 - 32 mmol/L   Glucose, Bld 83 70 - 99 mg/dL    Comment: Glucose reference range applies only to samples taken after fasting for at least 8 hours.   BUN 20 6 - 20 mg/dL   Creatinine, Ser 1.61 (H) 0.44 - 1.00 mg/dL   Calcium 09.6 8.9 - 04.5 mg/dL   Total Protein 8.3 (H) 6.5 - 8.1 g/dL   Albumin 4.9 3.5 - 5.0 g/dL   AST 27 15 - 41 U/L    Comment: HEMOLYSIS AT THIS LEVEL MAY AFFECT RESULT   ALT 21 0 - 44 U/L    Comment: HEMOLYSIS AT THIS LEVEL MAY AFFECT RESULT   Alkaline  Phosphatase 51 38 - 126 U/L   Total Bilirubin 1.8 (H) 0.3 - 1.2 mg/dL    Comment: HEMOLYSIS AT THIS LEVEL MAY AFFECT RESULT   GFR, Estimated 25 (L) >60 mL/min    Comment: (NOTE) Calculated using the CKD-EPI Creatinine Equation (2021)    Anion gap 19 (H) 5 - 15    Comment: Performed at [facility], [address] [place], [place] [address]  CBC     Status: Abnormal   Collection Time: 05/07/23  4:20 PM  Result Value Ref Range   WBC 20.8 (H) 4.0 - 10.5 K/uL   RBC 5.08 3.87 - 5.11 MIL/uL   Hemoglobin 15.6 (H) 12.0 - 15.0 g/dL   HCT 19.1 47.8 - 29.5 %   MCV 85.0 80.0 - 100.0 fL   MCH 30.7 26.0 - 34.0 pg   MCHC 36.1 (H) 30.0 - 36.0 g/dL   RDW 62.1 30.8 - 65.7 %   Platelets 419 (H) 150 - 400 K/uL   nRBC 0.0 0.0 - 0.2 %    Comment: Performed at [facility], [address], [place], [place] [address]  hCG, quantitative, pregnancy     Status: None   Collection Time: 05/07/23  4:21 PM  Result Value Ref Range   hCG, Beta Chain, Quant, S <1 <5 mIU/mL    Comment:          GEST. AGE      CONC.  (mIU/mL)   <=1 WEEK        5 - 50     2 WEEKS       50 - 500     3 WEEKS       100 - 10,000     4 WEEKS     1,000 - 30,000     5 WEEKS     3,500 - 115,000   6-8 WEEKS     12,000 - 270,000    12 WEEKS     15,000 - 220,000        FEMALE AND NON-PREGNANT FEMALE:     LESS THAN 5 mIU/mL Performed at [facility], [address], [place], [place] [address]   CBC     Status: Abnormal   Collection Time: 05/08/23  1:27 AM  Result Value Ref Range   WBC 17.1 (H) 4.0 - 10.5 K/uL   RBC 4.08 3.87 - 5.11 MIL/uL   Hemoglobin 12.5 12.0 - 15.0 g/dL   HCT 41.3 (L) 24.4 - 01.0 %   MCV 86.3 80.0 - 100.0 fL   MCH 30.6 26.0 - 34.0 pg   MCHC 35.5 30.0 - 36.0 g/dL   RDW 27.2 53.6 - 64.4 %   Platelets 329 150 -  400 K/uL   nRBC 0.0 0.0 - 0.2 %    Comment: Performed at [facility] [place] [facility], [address], [place], [place] [address]  Comprehensive metabolic panel     Status: Abnormal   Collection  Time: 05/08/23  1:27 AM  Result Value Ref Range   Sodium 133 (L) 135 - 145 mmol/L   Potassium 3.2 (L) 3.5 - 5.1 mmol/L   Chloride 98 98 - 111 mmol/L   CO2 23 22 - 32 mmol/L   Glucose, Bld 124 (H) 70 - 99 mg/dL    Comment: Glucose reference range applies only to samples taken after fasting for at least 8 hours.   BUN 18 6 - 20 mg/dL   Creatinine, Ser 6.04 (H) 0.44 - 1.00 mg/dL    Comment: DELTA CHECK NOTED   Calcium 8.9 8.9 - 10.3 mg/dL   Total Protein 6.6 6.5 - 8.1 g/dL   Albumin 3.7 3.5 - 5.0 g/dL   AST 17 15 - 41 U/L   ALT 14 0 - 44 U/L   Alkaline Phosphatase 42 38 - 126 U/L   Total Bilirubin 1.1 0.3 - 1.2 mg/dL   GFR, Estimated 59 (L) >60 mL/min    Comment: (NOTE) Calculated using the CKD-EPI Creatinine Equation (2021)    Anion gap 12 5 - 15    Comment: Performed at [facility], [address], [place], [place] [address]  Urinalysis, Routine w reflex microscopic -Urine, Clean Catch     Status: Abnormal   Collection Time: 05/08/23  8:32 AM  Result Value Ref Range   Color, Urine YELLOW YELLOW   APPearance CLEAR CLEAR   Specific Gravity, Urine 1.017 1.005 - 1.030   pH 6.0 5.0 - 8.0   Glucose, UA NEGATIVE NEGATIVE mg/dL   Hgb urine dipstick MODERATE (A) NEGATIVE   Bilirubin Urine NEGATIVE NEGATIVE   Ketones, ur NEGATIVE NEGATIVE mg/dL   Protein, ur 30 (A) NEGATIVE mg/dL   Nitrite NEGATIVE NEGATIVE   Leukocytes,Ua NEGATIVE NEGATIVE   RBC / HPF 0-5 0 - 5 RBC/hpf   WBC, UA 0-5 0 - 5 WBC/hpf   Bacteria, UA NONE SEEN NONE SEEN   Squamous Epithelial / HPF 0-5 0 - 5 /HPF   Mucus PRESENT     Comment: Performed at [facility], [address] [place], [place] [address]   CT Chest Wo Contrast  Result Date: 05/07/2023 CLINICAL DATA:  Status post trauma. EXAM: CT CHEST WITHOUT CONTRAST TECHNIQUE: Multidetector CT imaging of the chest was performed following the standard protocol without IV contrast. RADIATION DOSE REDUCTION: This exam was performed according to the  departmental dose-optimization program which includes automated exposure control, adjustment of the mA and/or kV according to patient size and/or use of iterative reconstruction technique. COMPARISON:  None Available. FINDINGS: Cardiovascular: No significant vascular findings. Normal heart size. No pericardial effusion. Mediastinum/Nodes: A very large amount of air is seen throughout the anterior and superior mediastinum. No enlarged mediastinal or axillary lymph nodes. Thyroid gland, trachea, and esophagus demonstrate no significant findings. Lungs/Pleura: There is no evidence of acute infiltrate or pleural effusion. A very small right apical and lateral right upper lobe pneumothorax is seen. Upper Abdomen: No acute abnormality. Musculoskeletal: A very large amount of soft tissue air is seen within the bilateral neck soft tissues. A mild amount of soft tissue air is seen within the superior aspect of the anterior left chest wall. A marked amount of soft tissue air is present within the  anterior and posterior aspects of the right chest wall. A mild-to-moderate amount of air is seen along the posterior and lateral aspects of the spinal cord, right greater than left. No acute osseous abnormalities are identified. IMPRESSION: 1. Very large pneumomediastinum, as described above. 2. Very small right apical and lateral right upper lobe pneumothorax. 3. Marked amount of soft tissue air within the bilateral neck soft tissues, right chest wall and to a lesser degree within the left chest wall. 4. No acute osseous abnormalities. Electronically Signed   By: Aram Candela M.D.   On: 05/07/2023 20:02   DG Chest Port 1 View  Result Date: 05/07/2023 CLINICAL DATA:  Chest pain EXAM: PORTABLE CHEST 1 VIEW COMPARISON:  03/16/2018 FINDINGS: There is pneumomediastinum and gas noted within the right chest wall and neck. No definite visible pneumothorax. Lungs clear. No effusions. Heart is normal size. No acute bony abnormality.  IMPRESSION: Pneumomediastinum and subcutaneous emphysema within the neck bilaterally and right chest wall. No visible pneumothorax. These results were called by telephone at the time of interpretation on 05/07/2023 at 7:03 pm to provider Alvino Blood , who verbally acknowledged these results. Electronically Signed   By: Charlett Nose M.D.   On: 05/07/2023 19:04   CT ABDOMEN PELVIS WO CONTRAST  Result Date: 05/07/2023 CLINICAL DATA:  Abdominal pain, acute, nonlocalized EXAM: CT ABDOMEN AND PELVIS WITHOUT CONTRAST TECHNIQUE: Multidetector CT imaging of the abdomen and pelvis was performed following the standard protocol without IV contrast. RADIATION DOSE REDUCTION: This exam was performed according to the departmental dose-optimization program which includes automated exposure control, adjustment of the mA and/or kV according to patient size and/or use of iterative reconstruction technique. COMPARISON:  05/26/2019 FINDINGS: Lower chest: Air is seen in the posterior mediastinum adjacent to the distal esophagus compatible with pneumomediastinum. No visible pneumothorax. No confluent opacities or effusions. Hepatobiliary: No focal hepatic abnormality. Gallbladder unremarkable. Pancreas: No focal abnormality or ductal dilatation. Spleen: No focal abnormality.  Normal size. Adrenals/Urinary Tract: No adrenal abnormality. No focal renal abnormality. No stones or hydronephrosis. Urinary bladder is unremarkable. Stomach/Bowel: Normal appendix. Stomach, large and small bowel grossly unremarkable. Vascular/Lymphatic: No evidence of aneurysm or adenopathy. Reproductive: Uterus and adnexa unremarkable.  No mass. Other: No free fluid or free air. Musculoskeletal: No acute bony abnormality. IMPRESSION: Pneumomediastinum seen within the lower posterior mediastinum adjacent to the distal esophagus. No acute findings in the abdomen or pelvis. These results were called by telephone at the time of interpretation on 05/07/2023 at 7:02  pm to provider [facility] , who verbally acknowledged these results. For Electronically Signed   By: Charlett Nose M.D.   On: 05/07/2023 19:02    Pending Labs Unresulted Labs (From admission, onward)     Start     Ordered   05/09/23 0500  Basic metabolic panel  Tomorrow morning,   R        05/08/23 1145   05/09/23 0500  Magnesium  Tomorrow morning,   R        05/08/23 1145   05/09/23 0500  CBC  Tomorrow morning,   R        05/08/23 1145            Vitals/Pain Today's Vitals   05/08/23 0600 05/08/23 0700 05/08/23 0815 05/08/23 1113  BP: (!) 123/109 112/66 112/75 110/70  Pulse: 90 86 78 78  Resp: 18 17 13 16   Temp:   98.6 F (37 C) 98.2 F (36.8 C)  TempSrc:  SpO2: 99% 100% 100% 100%  Height:      PainSc:        Isolation Precautions No active isolations  Medications Medications  diatrizoate meglumine-sodium (GASTROGRAFIN) 66-10 % solution 30 mL (30 mLs Oral Not Given 05/07/23 2139)  HYDROmorphone (DILAUDID) injection 0.5 mg (has no administration in time range)  ondansetron (ZOFRAN) injection 4 mg (has no administration in time range)  sodium chloride flush (NS) 0.9 % injection 3 mL (3 mLs Intravenous Given 05/08/23 0909)  acetaminophen (TYLENOL) tablet 650 mg (has no administration in time range)    Or  acetaminophen (TYLENOL) suppository 650 mg (has no administration in time range)  lactated ringers infusion (0 mLs Intravenous Stopped 05/08/23 0751)  senna-docusate (Senokot-S) tablet 1 tablet (has no administration in time range)  ondansetron (ZOFRAN) injection 4 mg (4 mg Intravenous Given 05/07/23 1628)  droperidol (INAPSINE) 2.5 MG/ML injection 2.5 mg (2.5 mg Intravenous Given 05/07/23 1823)  HYDROmorphone (DILAUDID) injection 0.5 mg (0.5 mg Intravenous Given 05/07/23 1823)  lactated ringers bolus 2,000 mL (0 mLs Intravenous Stopped 05/07/23 2144)    Mobility walks     Focused Assessments    R Recommendations: See Admitting Provider Note  Report given  to:   Additional Notes:

## 2023-05-09 ENCOUNTER — Other Ambulatory Visit (HOSPITAL_COMMUNITY): Payer: Self-pay

## 2023-05-09 ENCOUNTER — Inpatient Hospital Stay (HOSPITAL_COMMUNITY): Payer: Medicaid Other

## 2023-05-09 DIAGNOSIS — J982 Interstitial emphysema: Secondary | ICD-10-CM | POA: Diagnosis not present

## 2023-05-09 LAB — CBC
HCT: 32.8 % — ABNORMAL LOW (ref 36.0–46.0)
Hemoglobin: 11.6 g/dL — ABNORMAL LOW (ref 12.0–15.0)
MCH: 31.3 pg (ref 26.0–34.0)
MCHC: 35.4 g/dL (ref 30.0–36.0)
MCV: 88.4 fL (ref 80.0–100.0)
Platelets: 298 10*3/uL (ref 150–400)
RBC: 3.71 MIL/uL — ABNORMAL LOW (ref 3.87–5.11)
RDW: 13.2 % (ref 11.5–15.5)
WBC: 9.8 10*3/uL (ref 4.0–10.5)
nRBC: 0 % (ref 0.0–0.2)

## 2023-05-09 LAB — BASIC METABOLIC PANEL
Anion gap: 7 (ref 5–15)
BUN: 8 mg/dL (ref 6–20)
CO2: 25 mmol/L (ref 22–32)
Calcium: 8.5 mg/dL — ABNORMAL LOW (ref 8.9–10.3)
Chloride: 104 mmol/L (ref 98–111)
Creatinine, Ser: 0.92 mg/dL (ref 0.44–1.00)
GFR, Estimated: >60 mL/min (ref >60)
Glucose, Bld: 94 mg/dL (ref 70–99)
Potassium: 3.2 mmol/L — ABNORMAL LOW (ref 3.5–5.1)
Sodium: 136 mmol/L (ref 135–145)

## 2023-05-09 LAB — MAGNESIUM: Magnesium: 1.8 mg/dL (ref 1.7–2.4)

## 2023-05-09 MED ORDER — ONDANSETRON HCL 4 MG PO TABS
4.0000 mg | ORAL_TABLET | Freq: Every day | ORAL | 0 refills | Status: AC | PRN
  Filled 2023-05-09: qty 20, 20d supply, fill #0

## 2023-05-09 MED ORDER — AMOXICILLIN-POT CLAVULANATE 875-125 MG PO TABS
1.0000 | ORAL_TABLET | Freq: Two times a day (BID) | ORAL | 0 refills | Status: DC
  Filled 2023-05-09: qty 6, 3d supply, fill #0

## 2023-05-09 MED ORDER — ACETAMINOPHEN 325 MG PO TABS: 650.0000 mg | ORAL_TABLET | Freq: Four times a day (QID) | ORAL | Status: AC | PRN

## 2023-05-09 NOTE — TOC Transition Note (Signed)
Transition of Care [facility]) - CM/SW Discharge Note   Patient Details  Name: Heather Joyce MRN: [id number] Date of Birth: 03/20/1991  [facility]) CM/SW Contact:  Harriet Masson, RN Phone Number: 05/09/2023, 2:37 PM   Clinical Narrative:    Patient stable for discharge.  Patient prefers to make her own PCP apt. No other TOC needs.   Final next level of care: Home/Self Care Barriers to Discharge: Barriers Resolved   Patient Goals and CMS Choice  Return home    Discharge Placement    home                     Discharge Plan and Services Additional resources added to the After Visit Summary for                                       Social Determinants of Health (SDOH) Interventions SDOH Screenings   Tobacco Use: Low Risk  (05/07/2023)     Readmission Risk Interventions    05/09/2023    2:37 PM  Readmission Risk Prevention Plan  Post Dischage Appt Not Complete  Appt Comments patient will schedule her own PCP apt. she has a few recommenndations  Medication Screening Complete  Transportation Screening Complete

## 2023-05-09 NOTE — Progress Notes (Signed)
Went over discharge paper work with patient. All questions answered. PIV and telemetry removed. All belongings at bedside.  

## 2023-05-09 NOTE — Discharge Summary (Signed)
Physician Discharge Summary  Heather Joyce XBJ:[id number] DOB: August 11, 1991 DOA: 05/07/2023  PCP: Patient, No Pcp Per  Admit date: 05/07/2023 Discharge date: 05/09/2023  Admitted From: home Discharge disposition: home   Recommendations for Outpatient Follow-Up:   Stop using marijuana    Discharge Diagnosis:   Principal Problem:   Pneumomediastinum (HCC) Active Problems:   AKI (acute kidney injury) (HCC)   Cyclical vomiting syndrome    Discharge Condition: Improved.  Diet recommendation: Regular.  Wound care: None.  Code status: Full.   History of Present Illness:   Heather Joyce is a 32 y.o. female with medical history significant for marijuana use and cyclical vomiting syndrome who presented to the ED for evaluation of frequent nausea and vomiting.   Patient states that she has been having frequent nausea and vomiting since this past Thursday 7/4.  She has not had any bloody emesis.  Today she developed pain involving her right upper chest and back as well as the right neck area.  She has had some shortness of breath as well as cough with deep inspiration.   She has been feeling very dehydrated and reports diminished urine output.  Her nausea and vomiting have improved since receiving IV droperidol and she is asking to try to eat.  She states that she last used marijuana about 1 month ago.   Hospital Course by Problem:   Large pneumomediastinum Small right apical pneumothorax: Likely provoked from cyclical vomiting.  PCCM aware and to follow, no role for chest tube.  Recommendation is to place on supplemental O2 to maintain SpO2 100%. -per PCCM would send with few days of abx   Acute kidney injury-resolving Creatinine 2.56 on admission compared to 0.81 on 03/23/2023.  Secondary to volume depletion from GI losses.   -resolved   Cyclical vomiting syndrome: Received IV droperidol with no further symptoms Tolerating diet   Leukocytosis: Likely reactive  and due to hemoconcentration.  No obvious infection but being treated with abx   Mild hypokalemia -replete    Medical Consultants:      Discharge Exam:   Vitals:   05/09/23 0733 05/09/23 1125  BP: 124/80 110/74  Pulse:    Resp: (!) 21 14  Temp: 98.3 F (36.8 C) 98.6 F (37 C)  SpO2: 98% 97%   Vitals:   05/09/23 0013 05/09/23 0337 05/09/23 0733 05/09/23 1125  BP: 103/75 121/81 124/80 110/74  Pulse: 82 72    Resp: 15 19 (!) 21 14  Temp: 98.5 F (36.9 C) 98.3 F (36.8 C) 98.3 F (36.8 C) 98.6 F (37 C)  TempSrc: Oral Oral Oral Oral  SpO2: 99% 98% 98% 97%  Weight:      Height:        General exam: Appears calm and comfortable.   The results of significant diagnostics from this hospitalization (including imaging, microbiology, ancillary and laboratory) are listed below for reference.     Procedures and Diagnostic Studies:   CT Chest Wo Contrast  Result Date: 05/07/2023 CLINICAL DATA:  Status post trauma. EXAM: CT CHEST WITHOUT CONTRAST TECHNIQUE: Multidetector CT imaging of the chest was performed following the standard protocol without IV contrast. RADIATION DOSE REDUCTION: This exam was performed according to the departmental dose-optimization program which includes automated exposure control, adjustment of the mA and/or kV according to patient size and/or use of iterative reconstruction technique. COMPARISON:  None Available. FINDINGS: Cardiovascular: No significant vascular findings. Normal heart size. No pericardial effusion. Mediastinum/Nodes: A very large  amount of air is seen throughout the anterior and superior mediastinum. No enlarged mediastinal or axillary lymph nodes. Thyroid gland, trachea, and esophagus demonstrate no significant findings. Lungs/Pleura: There is no evidence of acute infiltrate or pleural effusion. A very small right apical and lateral right upper lobe pneumothorax is seen. Upper Abdomen: No acute abnormality. Musculoskeletal: A very large  amount of soft tissue air is seen within the bilateral neck soft tissues. A mild amount of soft tissue air is seen within the superior aspect of the anterior left chest wall. A marked amount of soft tissue air is present within the anterior and posterior aspects of the right chest wall. A mild-to-moderate amount of air is seen along the posterior and lateral aspects of the spinal cord, right greater than left. No acute osseous abnormalities are identified. IMPRESSION: 1. Very large pneumomediastinum, as described above. 2. Very small right apical and lateral right upper lobe pneumothorax. 3. Marked amount of soft tissue air within the bilateral neck soft tissues, right chest wall and to a lesser degree within the left chest wall. 4. No acute osseous abnormalities. Electronically Signed   By: Aram Candela M.D.   On: 05/07/2023 20:02   DG Chest Port 1 View  Result Date: 05/07/2023 CLINICAL DATA:  Chest pain EXAM: PORTABLE CHEST 1 VIEW COMPARISON:  03/16/2018 FINDINGS: There is pneumomediastinum and gas noted within the right chest wall and neck. No definite visible pneumothorax. Lungs clear. No effusions. Heart is normal size. No acute bony abnormality. IMPRESSION: Pneumomediastinum and subcutaneous emphysema within the neck bilaterally and right chest wall. No visible pneumothorax. These results were called by telephone at the time of interpretation on 05/07/2023 at 7:03 pm to provider Alvino Blood , who verbally acknowledged these results. Electronically Signed   By: Charlett Nose M.D.   On: 05/07/2023 19:04   CT ABDOMEN PELVIS WO CONTRAST  Result Date: 05/07/2023 CLINICAL DATA:  Abdominal pain, acute, nonlocalized EXAM: CT ABDOMEN AND PELVIS WITHOUT CONTRAST TECHNIQUE: Multidetector CT imaging of the abdomen and pelvis was performed following the standard protocol without IV contrast. RADIATION DOSE REDUCTION: This exam was performed according to the departmental dose-optimization program which includes  automated exposure control, adjustment of the mA and/or kV according to patient size and/or use of iterative reconstruction technique. COMPARISON:  05/26/2019 FINDINGS: Lower chest: Air is seen in the posterior mediastinum adjacent to the distal esophagus compatible with pneumomediastinum. No visible pneumothorax. No confluent opacities or effusions. Hepatobiliary: No focal hepatic abnormality. Gallbladder unremarkable. Pancreas: No focal abnormality or ductal dilatation. Spleen: No focal abnormality.  Normal size. Adrenals/Urinary Tract: No adrenal abnormality. No focal renal abnormality. No stones or hydronephrosis. Urinary bladder is unremarkable. Stomach/Bowel: Normal appendix. Stomach, large and small bowel grossly unremarkable. Vascular/Lymphatic: No evidence of aneurysm or adenopathy. Reproductive: Uterus and adnexa unremarkable.  No mass. Other: No free fluid or free air. Musculoskeletal: No acute bony abnormality. IMPRESSION: Pneumomediastinum seen within the lower posterior mediastinum adjacent to the distal esophagus. No acute findings in the abdomen or pelvis. These results were called by telephone at the time of interpretation on 05/07/2023 at 7:02 pm to provider [facility] , who verbally acknowledged these results. For Electronically Signed   By: Charlett Nose M.D.   On: 05/07/2023 19:02     Labs:   Basic Metabolic Panel: Recent Labs  Lab 05/07/23 1620 05/08/23 0127 05/09/23 0030  NA 135 133* 136  K 3.6 3.2* 3.2*  CL 98 98 104  CO2 18* 23 25  GLUCOSE  83 124* 94  BUN 20 18 8   CREATININE 2.56* 1.26* 0.92  CALCIUM 10.0 8.9 8.5*  MG  --   --  1.8   GFR Estimated Creatinine Clearance: 77.9 mL/min (by C-G formula based on SCr of 0.92 mg/dL). Liver Function Tests: Recent Labs  Lab 05/07/23 1620 05/08/23 0127  AST 27 17  ALT 21 14  ALKPHOS 51 42  BILITOT 1.8* 1.1  PROT 8.3* 6.6  ALBUMIN 4.9 3.7   Recent Labs  Lab 05/07/23 1620  LIPASE 37   No results for input(s):  "AMMONIA" in the last 168 hours. Coagulation profile No results for input(s): "INR", "PROTIME" in the last 168 hours.  CBC: Recent Labs  Lab 05/07/23 1620 05/08/23 0127 05/09/23 0030  WBC 20.8* 17.1* 9.8  HGB 15.6* 12.5 11.6*  HCT 43.2 35.2* 32.8*  MCV 85.0 86.3 88.4  PLT 419* 329 298   Cardiac Enzymes: No results for input(s): "CKTOTAL", "CKMB", "CKMBINDEX", "TROPONINI" in the last 168 hours. BNP: Invalid input(s): "POCBNP" CBG: No results for input(s): "GLUCAP" in the last 168 hours. D-Dimer No results for input(s): "DDIMER" in the last 72 hours. Hgb A1c No results for input(s): "HGBA1C" in the last 72 hours. Lipid Profile No results for input(s): "CHOL", "HDL", "LDLCALC", "TRIG", "CHOLHDL", "LDLDIRECT" in the last 72 hours. Thyroid function studies No results for input(s): "TSH", "T4TOTAL", "T3FREE", "THYROIDAB" in the last 72 hours.  Invalid input(s): "FREET3" Anemia work up No results for input(s): "VITAMINB12", "FOLATE", "FERRITIN", "TIBC", "IRON", "RETICCTPCT" in the last 72 hours. Microbiology Recent Results (from the past 240 hour(s))  MRSA Next Gen by PCR, Nasal     Status: None   Collection Time: 05/08/23  1:48 PM   Specimen: Nasal Mucosa; Nasal Swab  Result Value Ref Range Status   MRSA by PCR Next Gen NOT DETECTED NOT DETECTED Final    Comment: (NOTE) The GeneXpert MRSA Assay (FDA approved for NASAL specimens only), is one component of a comprehensive MRSA colonization surveillance program. It is not intended to diagnose MRSA infection nor to guide or monitor treatment for MRSA infections. Test performance is not FDA approved in patients less than 46 years old. Performed at [facility], [address] [place], [place] [address]      Discharge Instructions:   Discharge Instructions     Diet general   Complete by: As directed    Discharge instructions   Complete by: As directed    Stop using marijuana -zofran as needed for nausea (can  cause constipation)   Increase activity slowly   Complete by: As directed       Allergies as of 05/09/2023       Reactions   Penicillins Anaphylaxis, Other (See Comments)   Has patient had a PCN reaction causing immediate rash, facial/tongue/throat swelling, SOB or lightheadedness with hypotension: Yes Has patient had a PCN reaction causing severe rash involving mucus membranes or skin necrosis: No Has patient had a PCN reaction that required hospitalization: No Has patient had a PCN reaction occurring within the last 10 years: No If all of the above answers are "NO", then may proceed with Cephalosporin use.   Pineapple Anaphylaxis   Food Rash, Other (See Comments)   Pt is allergic to peanuts        Medication List     STOP taking these medications    megestrol 40 MG tablet Commonly known as: MEGACE   metroNIDAZOLE 500 MG tablet Commonly known as: FLAGYL  TAKE these medications    acetaminophen 325 MG tablet Commonly known as: TYLENOL Take 2 tablets (650 mg total) by mouth every 6 (six) hours as needed for mild pain (or Fever >/= 101).   amoxicillin-clavulanate 875-125 MG tablet Commonly known as: AUGMENTIN Take 1 tablet by mouth 2 (two) times daily.   ondansetron 4 MG tablet Commonly known as: Zofran Take 1 tablet (4 mg total) by mouth daily as needed for nausea or vomiting. What changed:  when to take this reasons to take this          Time coordinating discharge: 45 min  Signed:  Joseph Art DO  [facility] 05/09/2023, 2:15 PM

## 2024-08-08 ENCOUNTER — Ambulatory Visit: Payer: Self-pay

## 2024-09-05 ENCOUNTER — Ambulatory Visit
Admission: RE | Admit: 2024-09-05 | Discharge: 2024-09-05 | Disposition: A | Discharge status: Home / Self Care | Source: Ambulatory Visit | Attending: Family Medicine | Admitting: Family Medicine

## 2024-09-05 VITALS — BP 115/81 | HR 90 | Temp 98.4°F | Resp 18 | Ht 64.0 in | Wt 128.0 lb

## 2024-09-05 DIAGNOSIS — J069 Acute upper respiratory infection, unspecified: Secondary | ICD-10-CM | POA: Diagnosis not present

## 2024-09-05 LAB — POC COVID19/FLU A&B COMBO
Covid Antigen, POC: NEGATIVE
Influenza A Antigen, POC: NEGATIVE
Influenza B Antigen, POC: NEGATIVE

## 2024-09-05 NOTE — ED Provider Notes (Addendum)
 [facility]    CSN: [id number] Arrival date & time: 09/05/24  1433      History   Chief Complaint Chief Complaint  Patient presents with   Headache   Sore Throat    HPI Heather Joyce is a 33 y.o. female.    Headache Sore Throat Associated symptoms include headaches.  Here for headache and sore throat and nasal congestion and just a little bit of cough.  Symptoms began on November 5.  She has had temperature to 102.  No nausea vomiting or diarrhea.  No chest tightness or shortness of breath.  Past medical history is negative for asthma or any other chronic condition.  She does not take any medication for chronic conditions.  She is allergic to penicillin which causes anaphylaxis. Last menstrual cycle was October 31    Past Medical History:  Diagnosis Date   Rabies exposure     Patient Active Problem List   Diagnosis Date Noted   Pneumomediastinum (HCC) 05/07/2023   AKI (acute kidney injury) 05/07/2023   Cyclical vomiting syndrome 05/07/2023   SVD (spontaneous vaginal delivery) 07/05/2017   Shortness of breath    [redacted] weeks gestation of pregnancy    Gastroesophageal reflux disease    Chest pain 01/02/2017   Hyponatremia 01/02/2017   Hypokalemia 01/02/2017   Pregnancy 01/02/2017   Leukocytosis 01/02/2017    Past Surgical History:  Procedure Laterality Date   DENTAL SURGERY  2011   wisdom teeth removed    OB History     Gravida  2   Para  1   Term  1   Preterm      AB  1   Living  1      SAB  1   IAB      Ectopic      Multiple  0   Live Births  1            Home Medications    Prior to Admission medications   Medication Sig Start Date End Date Taking? Authorizing Provider  ibuprofen  (ADVIL ) 200 MG tablet Take 200 mg by mouth every 6 (six) hours as needed.   Yes [provider]  acetaminophen  (TYLENOL ) 325 MG tablet Take 2 tablets (650 mg total) by mouth every 6 (six) hours as needed for mild  pain (or Fever >/= 101). 05/09/23   Vann, Jessica U, DO  dicyclomine  (BENTYL ) 10 MG capsule Take 1 capsule (10 mg total) by mouth 3 (three) times daily before meals. Patient not taking: Reported on 05/26/2019 07/25/18 05/26/19  Aneita Gwendlyn DASEN, MD  pantoprazole  (PROTONIX ) 40 MG tablet Take 1 tablet (40 mg total) by mouth daily. Patient not taking: Reported on 05/26/2019 08/07/18 05/26/19  Aneita Gwendlyn DASEN, MD    Family History Family History  Problem Relation Age of Onset   Hypertension Father    Hypertension Paternal Grandfather    Esophageal cancer Paternal Grandfather    Colon cancer Neg Hx     Social History Social History   Tobacco Use   Smoking status: Never   Smokeless tobacco: Never  Vaping Use   Vaping status: Never Used  Substance Use Topics   Alcohol use: Yes    Comment: ocassionally   Drug use: Yes    Types: Marijuana     Allergies   Penicillins, Pineapple, and Food   Review of Systems Review of Systems  Neurological:  Positive for headaches.     Physical Exam Triage Vital Signs  ED Triage Vitals  Encounter Vitals Group     BP 09/05/24 1457 115/81     Girls Systolic BP Percentile --      Girls Diastolic BP Percentile --      Boys Systolic BP Percentile --      Boys Diastolic BP Percentile --      Pulse Rate 09/05/24 1457 90     Resp 09/05/24 1457 18     Temp 09/05/24 1457 98.4 F (36.9 C)     Temp Source 09/05/24 1457 Oral     SpO2 09/05/24 1457 96 %     Weight 09/05/24 1455 128 lb (58.1 kg)     Height 09/05/24 1455 5' 4 (1.626 m)     Head Circumference --      Peak Flow --      Pain Score 09/05/24 1452 7     Pain Loc --      Pain Education --      Exclude from Growth Chart --    No data found.  Updated Vital Signs BP 115/81 (BP Location: Left Arm)   Pulse 90   Temp 98.4 F (36.9 C) (Oral)   Resp 18   Ht 5' 4 (1.626 m)   Wt 58.1 kg   LMP 08/29/2024   SpO2 96%   BMI 21.97 kg/m   Visual Acuity Right Eye Distance:   Left Eye  Distance:   Bilateral Distance:    Right Eye Near:   Left Eye Near:    Bilateral Near:     Physical Exam Vitals reviewed.  Constitutional:      General: She is not in acute distress.    Appearance: She is not ill-appearing, toxic-appearing or diaphoretic.  HENT:     Right Ear: Tympanic membrane and ear canal normal.     Left Ear: Tympanic membrane and ear canal normal.     Nose: Congestion present.     Mouth/Throat:     Mouth: Mucous membranes are moist.     Comments: Clear exudate and mild erythema of the posterior OP Eyes:     Extraocular Movements: Extraocular movements intact.     Conjunctiva/sclera: Conjunctivae normal.     Pupils: Pupils are equal, round, and reactive to light.  Cardiovascular:     Rate and Rhythm: Normal rate and regular rhythm.     Heart sounds: No murmur heard. Pulmonary:     Effort: No respiratory distress.     Breath sounds: No stridor. No wheezing, rhonchi or rales.  Musculoskeletal:     Cervical back: Neck supple.  Lymphadenopathy:     Cervical: No cervical adenopathy.  Skin:    Capillary Refill: Capillary refill takes less than 2 seconds.     Coloration: Skin is not jaundiced or pale.  Neurological:     General: No focal deficit present.     Mental Status: She is alert and oriented to person, place, and time.  Psychiatric:        Behavior: Behavior normal.      UC Treatments / Results  Labs (all labs ordered are listed, but only abnormal results are displayed) Labs Reviewed  POC COVID19/FLU A&B COMBO - Normal    EKG   Radiology No results found.  Procedures Procedures (including critical care time)  Medications Ordered in UC Medications - No data to display  Initial Impression / Assessment and Plan / UC Course  I have reviewed the triage vital signs and the nursing notes.  Pertinent labs &  imaging results that were available during my care of the patient were reviewed by me and considered in my medical decision making  (see chart for details).     COVID and flu testing is negative.  She will continue over-the-counter medicines as needed Work note provided Final Clinical Impressions(s) / UC Diagnoses   Final diagnoses:  Viral URI     Discharge Instructions      The COVID and flu test was negative  You can continue Tylenol  or Advil  or TheraFlu as needed for your symptoms.     ED Prescriptions   None    PDMP not reviewed this encounter.   Vonna Sharlet POUR, MD 09/05/24 1529    Vonna Sharlet POUR, MD 09/05/24 [phone]

## 2024-09-05 NOTE — Discharge Instructions (Signed)
 The COVID and flu test was negative  You can continue Tylenol  or Advil  or TheraFlu as needed for your symptoms.

## 2024-09-05 NOTE — ED Triage Notes (Addendum)
 Patient reports ha starting Wednesday at 6pm when leaving work and remains. Scratchy/sore throat started yesterday. The last time I had these symptoms I tested + for COVID19. Fever noted yesterday of 102, none today (known). No cough. No runny nose (some congestion).

## 2024-09-05 NOTE — ED Triage Notes (Signed)
 I really just need a Covid test. - Entered by patient
